# Patient Record
Sex: Female | Born: 1970 | Race: Black or African American | Hispanic: No | Marital: Single | Smoking: Never smoker
Health system: Southern US, Community
[De-identification: ages and names within clinical notes are randomized; demographics above are authoritative.]

## PROBLEM LIST (undated history)

## (undated) DIAGNOSIS — R638 Other symptoms and signs concerning food and fluid intake: Secondary | ICD-10-CM

## (undated) DIAGNOSIS — D219 Benign neoplasm of connective and other soft tissue, unspecified: Secondary | ICD-10-CM

## (undated) DIAGNOSIS — B9689 Other specified bacterial agents as the cause of diseases classified elsewhere: Secondary | ICD-10-CM

## (undated) DIAGNOSIS — A749 Chlamydial infection, unspecified: Secondary | ICD-10-CM

## (undated) DIAGNOSIS — K219 Gastro-esophageal reflux disease without esophagitis: Secondary | ICD-10-CM

## (undated) DIAGNOSIS — T7840XA Allergy, unspecified, initial encounter: Secondary | ICD-10-CM

## (undated) DIAGNOSIS — O209 Hemorrhage in early pregnancy, unspecified: Secondary | ICD-10-CM

## (undated) DIAGNOSIS — N760 Acute vaginitis: Secondary | ICD-10-CM

## (undated) DIAGNOSIS — O2340 Unspecified infection of urinary tract in pregnancy, unspecified trimester: Secondary | ICD-10-CM

## (undated) DIAGNOSIS — Z8619 Personal history of other infectious and parasitic diseases: Secondary | ICD-10-CM

## (undated) DIAGNOSIS — N63 Unspecified lump in unspecified breast: Secondary | ICD-10-CM

## (undated) DIAGNOSIS — Z87440 Personal history of urinary (tract) infections: Secondary | ICD-10-CM

## (undated) DIAGNOSIS — B977 Papillomavirus as the cause of diseases classified elsewhere: Secondary | ICD-10-CM

## (undated) DIAGNOSIS — J45909 Unspecified asthma, uncomplicated: Secondary | ICD-10-CM

## (undated) DIAGNOSIS — O26851 Spotting complicating pregnancy, first trimester: Secondary | ICD-10-CM

## (undated) DIAGNOSIS — IMO0002 Reserved for concepts with insufficient information to code with codable children: Secondary | ICD-10-CM

## (undated) DIAGNOSIS — R896 Abnormal cytological findings in specimens from other organs, systems and tissues: Secondary | ICD-10-CM

## (undated) HISTORY — DX: Unspecified lump in unspecified breast: N63.0

## (undated) HISTORY — DX: Unspecified asthma, uncomplicated: J45.909

## (undated) HISTORY — DX: Hemorrhage in early pregnancy, unspecified: O20.9

## (undated) HISTORY — DX: Papillomavirus as the cause of diseases classified elsewhere: B97.7

## (undated) HISTORY — DX: Other specified bacterial agents as the cause of diseases classified elsewhere: B96.89

## (undated) HISTORY — DX: Personal history of other infectious and parasitic diseases: Z86.19

## (undated) HISTORY — DX: Acute vaginitis: N76.0

## (undated) HISTORY — DX: Abnormal cytological findings in specimens from other organs, systems and tissues: R89.6

## (undated) HISTORY — DX: Other symptoms and signs concerning food and fluid intake: R63.8

## (undated) HISTORY — DX: Personal history of urinary (tract) infections: Z87.440

## (undated) HISTORY — PX: GASTRIC RESTRICTION SURGERY: SHX653

## (undated) HISTORY — PX: FOOT ARTHRODESIS, MODIFIED MCBRIDE: SUR52

## (undated) HISTORY — DX: Reserved for concepts with insufficient information to code with codable children: IMO0002

## (undated) HISTORY — DX: Benign neoplasm of connective and other soft tissue, unspecified: D21.9

## (undated) HISTORY — DX: Unspecified infection of urinary tract in pregnancy, unspecified trimester: O23.40

## (undated) HISTORY — DX: Chlamydial infection, unspecified: A74.9

## (undated) HISTORY — DX: Allergy, unspecified, initial encounter: T78.40XA

## (undated) HISTORY — DX: Spotting complicating pregnancy, first trimester: O26.851

## (undated) HISTORY — DX: Gastro-esophageal reflux disease without esophagitis: K21.9

---

## 1997-04-15 DIAGNOSIS — IMO0002 Reserved for concepts with insufficient information to code with codable children: Secondary | ICD-10-CM

## 1997-04-15 DIAGNOSIS — R87619 Unspecified abnormal cytological findings in specimens from cervix uteri: Secondary | ICD-10-CM

## 1997-04-15 HISTORY — DX: Unspecified abnormal cytological findings in specimens from cervix uteri: R87.619

## 1997-04-15 HISTORY — DX: Reserved for concepts with insufficient information to code with codable children: IMO0002

## 2000-04-15 DIAGNOSIS — B977 Papillomavirus as the cause of diseases classified elsewhere: Secondary | ICD-10-CM

## 2000-04-15 HISTORY — DX: Papillomavirus as the cause of diseases classified elsewhere: B97.7

## 2001-01-16 ENCOUNTER — Other Ambulatory Visit: Admission: RE | Admit: 2001-01-16 | Discharge: 2001-01-16 | Payer: Self-pay | Admitting: Obstetrics and Gynecology

## 2001-07-16 ENCOUNTER — Other Ambulatory Visit: Admission: RE | Admit: 2001-07-16 | Discharge: 2001-07-16 | Payer: Self-pay | Admitting: Obstetrics and Gynecology

## 2001-07-27 ENCOUNTER — Other Ambulatory Visit: Admission: RE | Admit: 2001-07-27 | Discharge: 2001-07-27 | Payer: Self-pay | Admitting: Obstetrics and Gynecology

## 2002-05-28 ENCOUNTER — Other Ambulatory Visit: Admission: RE | Admit: 2002-05-28 | Discharge: 2002-05-28 | Payer: Self-pay | Admitting: Obstetrics and Gynecology

## 2002-08-06 ENCOUNTER — Encounter: Payer: Self-pay | Admitting: Internal Medicine

## 2002-08-06 ENCOUNTER — Encounter: Admission: RE | Admit: 2002-08-06 | Discharge: 2002-08-06 | Payer: Self-pay | Admitting: Internal Medicine

## 2003-09-06 ENCOUNTER — Other Ambulatory Visit: Admission: RE | Admit: 2003-09-06 | Discharge: 2003-09-06 | Payer: Self-pay | Admitting: Obstetrics and Gynecology

## 2004-09-06 ENCOUNTER — Other Ambulatory Visit: Admission: RE | Admit: 2004-09-06 | Discharge: 2004-09-06 | Payer: Self-pay | Admitting: Obstetrics and Gynecology

## 2004-10-13 HISTORY — PX: BUNIONECTOMY: SHX129

## 2005-09-13 ENCOUNTER — Other Ambulatory Visit: Admission: RE | Admit: 2005-09-13 | Discharge: 2005-09-13 | Payer: Self-pay | Admitting: Obstetrics and Gynecology

## 2006-04-15 DIAGNOSIS — N760 Acute vaginitis: Secondary | ICD-10-CM

## 2006-04-15 HISTORY — DX: Acute vaginitis: N76.0

## 2006-06-08 ENCOUNTER — Emergency Department (HOSPITAL_COMMUNITY): Admission: EM | Admit: 2006-06-08 | Discharge: 2006-06-08 | Payer: Self-pay | Admitting: Family Medicine

## 2007-04-16 DIAGNOSIS — B9689 Other specified bacterial agents as the cause of diseases classified elsewhere: Secondary | ICD-10-CM

## 2007-04-16 DIAGNOSIS — N63 Unspecified lump in unspecified breast: Secondary | ICD-10-CM

## 2007-04-16 HISTORY — DX: Other specified bacterial agents as the cause of diseases classified elsewhere: B96.89

## 2007-04-16 HISTORY — DX: Unspecified lump in unspecified breast: N63.0

## 2007-10-28 ENCOUNTER — Ambulatory Visit (HOSPITAL_COMMUNITY): Admission: RE | Admit: 2007-10-28 | Discharge: 2007-10-28 | Payer: Self-pay | Admitting: Surgery

## 2007-10-28 ENCOUNTER — Encounter: Admission: RE | Admit: 2007-10-28 | Discharge: 2007-10-28 | Payer: Self-pay | Admitting: Surgery

## 2007-10-30 ENCOUNTER — Ambulatory Visit (HOSPITAL_COMMUNITY): Admission: RE | Admit: 2007-10-30 | Discharge: 2007-10-30 | Payer: Self-pay | Admitting: Surgery

## 2007-10-30 IMAGING — US US ABDOMEN COMPLETE
1 series · 13 of 25 positions shown · non-contrast
Comparison: None

CLINICAL DATA: Morbid obesity

ABDOMEN ULTRASOUND
TECHNIQUE: Complete abdominal ultrasound examination was performed
including evaluation of the liver, gallbladder, bile ducts,
pancreas, kidneys, spleen, IVC, and abdominal aorta.

[Series 1: us abdomen complete · 0.28mm/px · 13 of 61 slices shown]
[im 1/61]
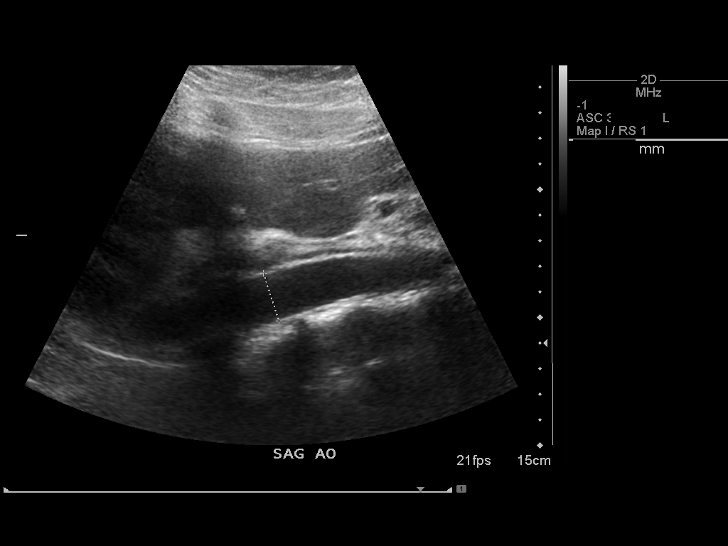
[im 6/61]
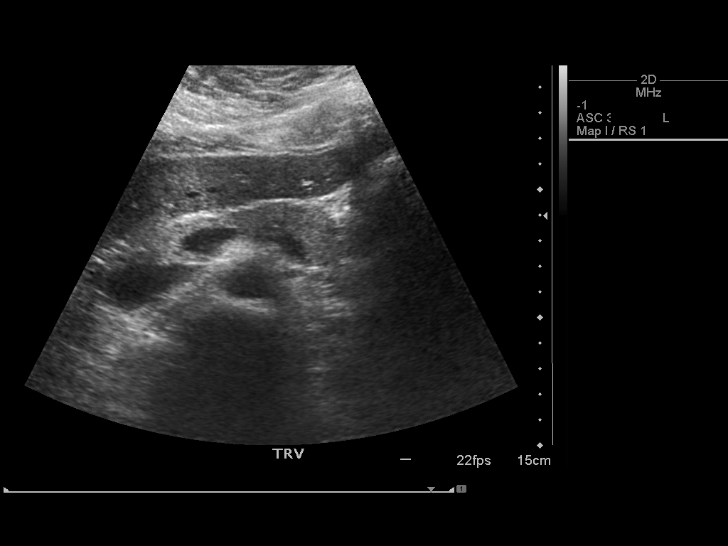
[im 11/61]
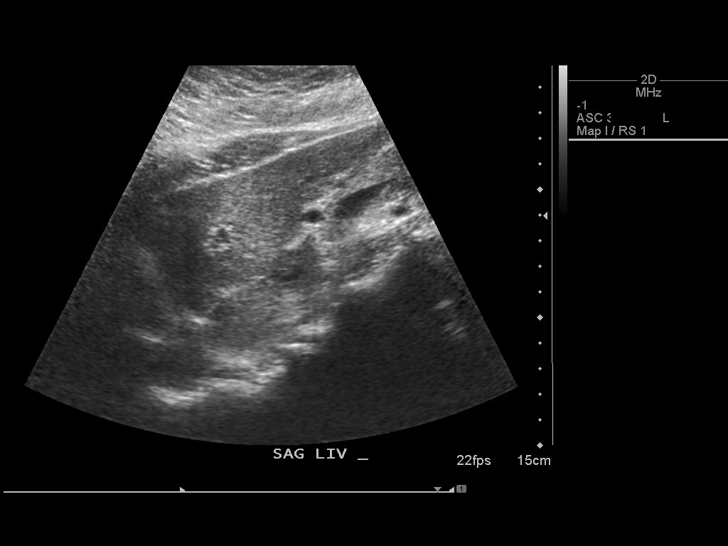
[im 16/61]
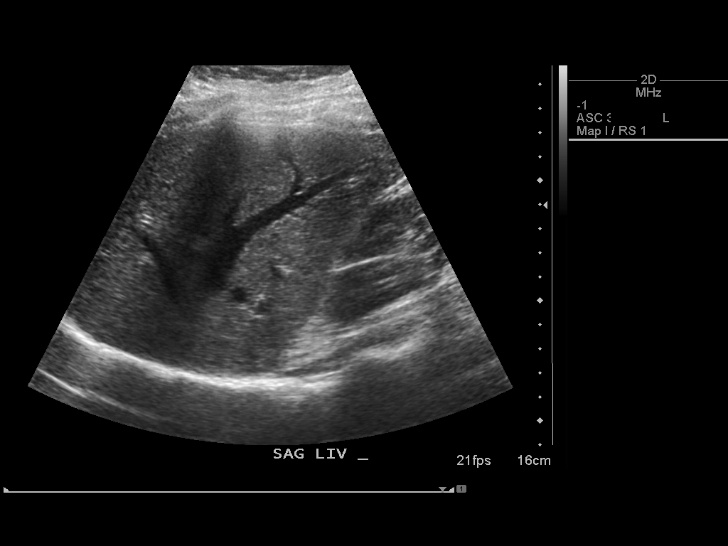
[im 21/61]
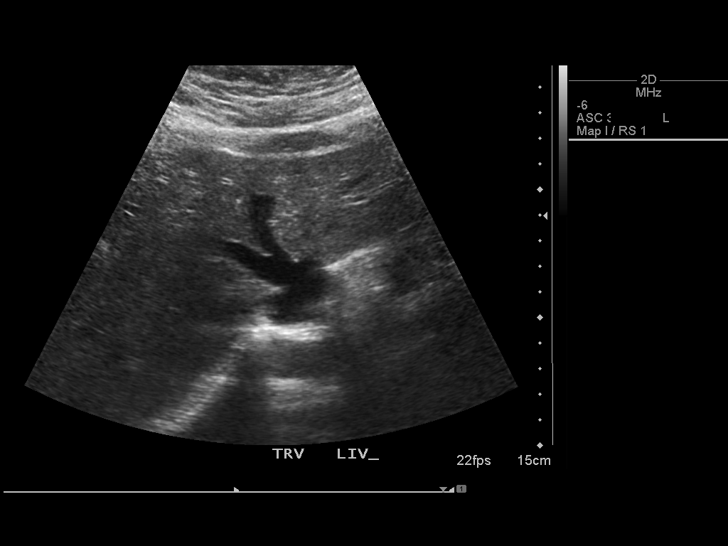
[im 26/61]
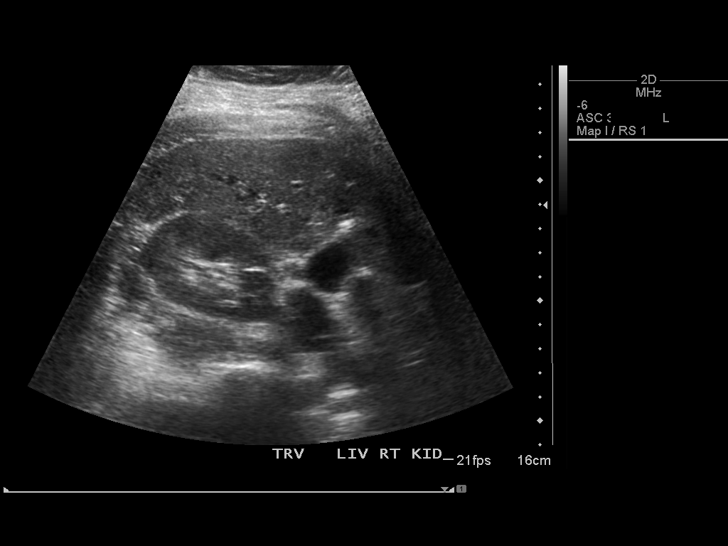
[im 31/61]
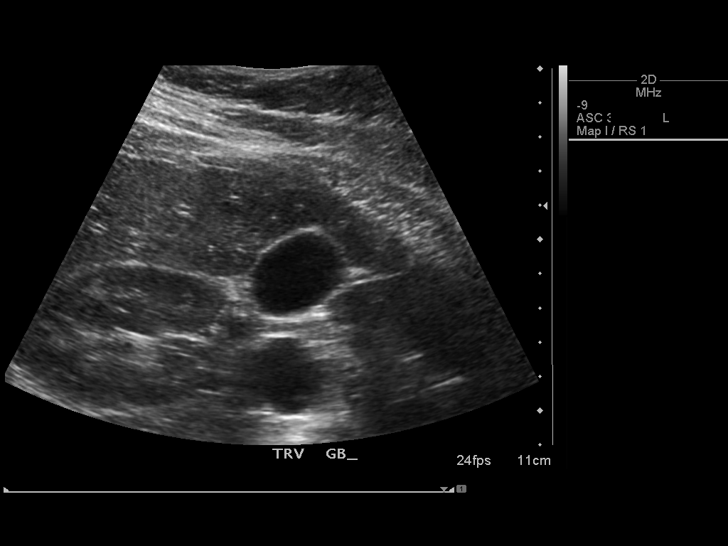
[im 36/61]
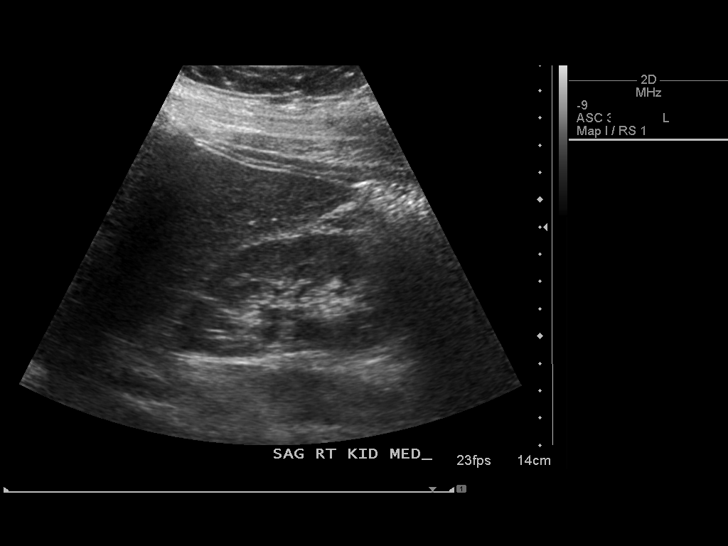
[im 41/61]
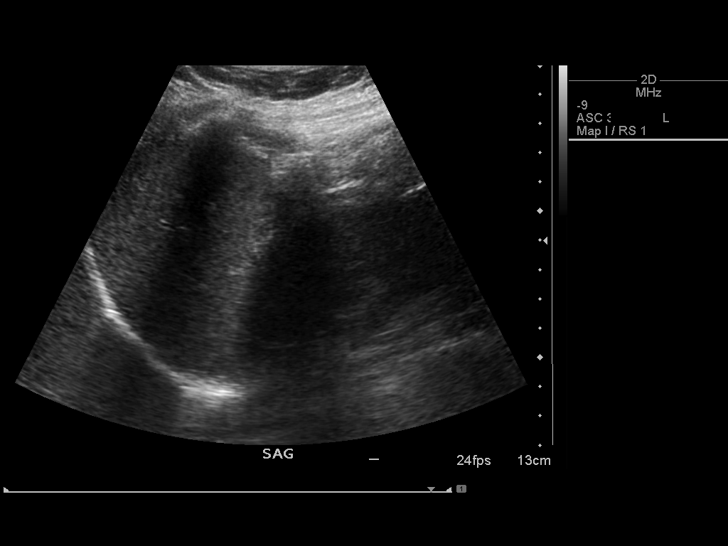
[im 46/61]
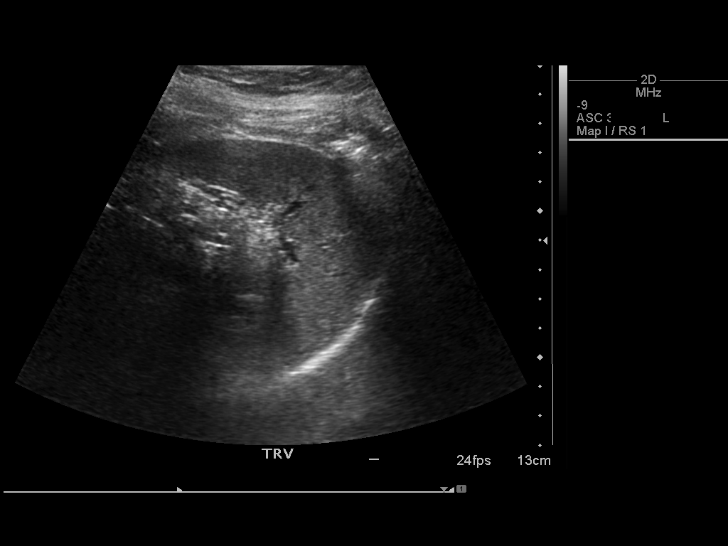
[im 51/61]
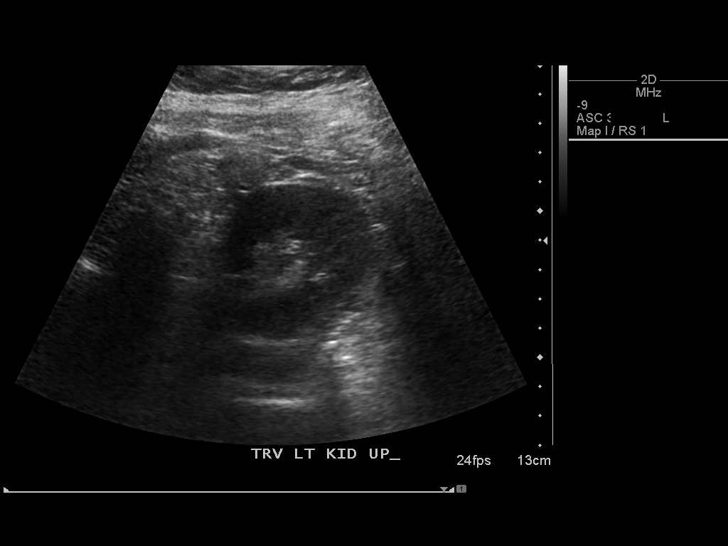
[im 56/61]
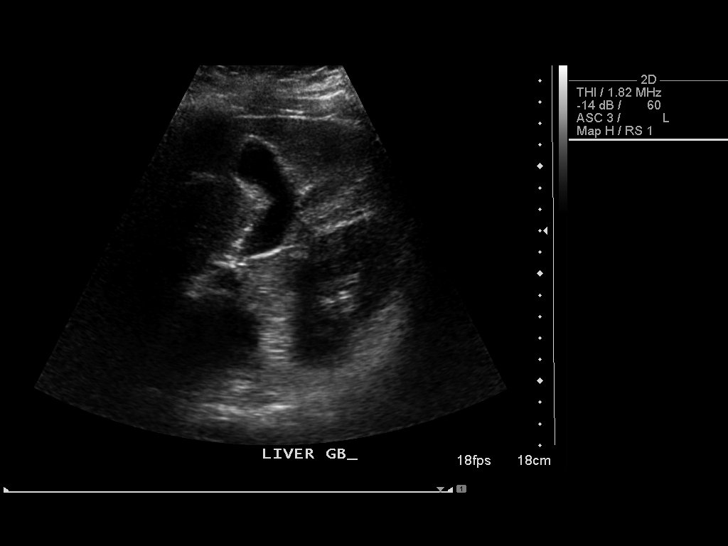
[im 61/61]
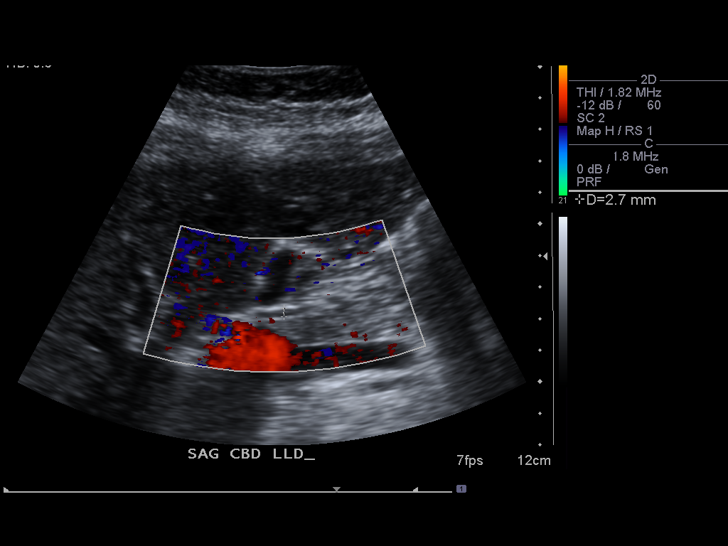

[13 of 25 positions shown; findings below may reference images not displayed]

FINDINGS: The liver parenchyma appears homogeneous in echotexture
with no evidence for focal parenchymal abnormality or intrahepatic
ductal dilatation.  The gallbladder is well distended and shows no
evidence for intraluminal stones or sludge.  No pericholecystic
fluid or gallbladder wall thickening is seen.  The common bile duct
measures 2.7 mm in AP width and this is within normal limits.

The pancreas is seen in its entirety and is normal in size and
echotexture.  Both kidneys have a normal appearance with the right
kidney having a sagittal length of 11.2 cm and the left kidney
having a sagittal length of 10.2 cm.  No focal parenchymal
abnormalities or signs of hydronephrosis are noted.  The spleen is
normal in appearance.

The abdominal aorta has a maximal caliber of 2.0 cm.  The proximal
portion of the inferior vena cava is unremarkable.  No intra-
abdominal fluid is seen.
IMPRESSION: Normal abdominal ultrasound

## 2008-02-14 DIAGNOSIS — O209 Hemorrhage in early pregnancy, unspecified: Secondary | ICD-10-CM

## 2008-02-14 HISTORY — DX: Hemorrhage in early pregnancy, unspecified: O20.9

## 2008-07-12 ENCOUNTER — Inpatient Hospital Stay (HOSPITAL_COMMUNITY): Admission: AD | Admit: 2008-07-12 | Discharge: 2008-07-12 | Payer: Self-pay | Admitting: Obstetrics and Gynecology

## 2008-07-20 ENCOUNTER — Ambulatory Visit (HOSPITAL_COMMUNITY): Admission: RE | Admit: 2008-07-20 | Discharge: 2008-07-20 | Payer: Self-pay | Admitting: Obstetrics and Gynecology

## 2008-08-03 ENCOUNTER — Ambulatory Visit (HOSPITAL_COMMUNITY): Admission: RE | Admit: 2008-08-03 | Discharge: 2008-08-03 | Payer: Self-pay | Admitting: Obstetrics and Gynecology

## 2008-08-05 ENCOUNTER — Inpatient Hospital Stay (HOSPITAL_COMMUNITY): Admission: AD | Admit: 2008-08-05 | Discharge: 2008-08-08 | Payer: Self-pay | Admitting: Obstetrics and Gynecology

## 2008-08-17 ENCOUNTER — Ambulatory Visit (HOSPITAL_COMMUNITY): Admission: RE | Admit: 2008-08-17 | Discharge: 2008-08-17 | Payer: Self-pay | Admitting: Obstetrics and Gynecology

## 2008-08-22 ENCOUNTER — Inpatient Hospital Stay (HOSPITAL_COMMUNITY): Admission: AD | Admit: 2008-08-22 | Discharge: 2008-08-27 | Payer: Self-pay | Admitting: Obstetrics and Gynecology

## 2008-09-13 DIAGNOSIS — O2340 Unspecified infection of urinary tract in pregnancy, unspecified trimester: Secondary | ICD-10-CM

## 2008-09-13 DIAGNOSIS — O26851 Spotting complicating pregnancy, first trimester: Secondary | ICD-10-CM

## 2008-09-13 HISTORY — DX: Unspecified infection of urinary tract in pregnancy, unspecified trimester: O23.40

## 2008-09-13 HISTORY — DX: Spotting complicating pregnancy, first trimester: O26.851

## 2008-09-30 ENCOUNTER — Inpatient Hospital Stay (HOSPITAL_COMMUNITY): Admission: RE | Admit: 2008-09-30 | Discharge: 2008-10-03 | Payer: Self-pay | Admitting: Obstetrics and Gynecology

## 2008-09-30 ENCOUNTER — Encounter (INDEPENDENT_AMBULATORY_CARE_PROVIDER_SITE_OTHER): Payer: Self-pay | Admitting: Obstetrics and Gynecology

## 2008-09-30 DIAGNOSIS — D219 Benign neoplasm of connective and other soft tissue, unspecified: Secondary | ICD-10-CM

## 2008-09-30 HISTORY — DX: Benign neoplasm of connective and other soft tissue, unspecified: D21.9

## 2009-10-10 ENCOUNTER — Ambulatory Visit (HOSPITAL_COMMUNITY): Admission: RE | Admit: 2009-10-10 | Discharge: 2009-10-10 | Payer: Self-pay | Admitting: Obstetrics and Gynecology

## 2009-10-10 HISTORY — PX: OTHER SURGICAL HISTORY: SHX169

## 2009-10-10 HISTORY — PX: MYOMECTOMY: SHX85

## 2009-11-22 ENCOUNTER — Encounter: Admission: RE | Admit: 2009-11-22 | Discharge: 2010-01-11 | Payer: Self-pay | Admitting: Surgery

## 2010-01-30 ENCOUNTER — Encounter
Admission: RE | Admit: 2010-01-30 | Discharge: 2010-04-18 | Payer: Self-pay | Source: Home / Self Care | Attending: Surgery | Admitting: Surgery

## 2010-02-13 ENCOUNTER — Ambulatory Visit (HOSPITAL_COMMUNITY): Admission: AD | Admit: 2010-02-13 | Discharge: 2010-02-14 | Payer: Self-pay | Admitting: Surgery

## 2010-02-13 HISTORY — PX: LAPAROSCOPIC GASTRIC BANDING: SHX1100

## 2010-02-14 ENCOUNTER — Ambulatory Visit: Payer: Self-pay | Admitting: Vascular Surgery

## 2010-02-14 ENCOUNTER — Encounter (INDEPENDENT_AMBULATORY_CARE_PROVIDER_SITE_OTHER): Payer: Self-pay | Admitting: Surgery

## 2010-02-27 ENCOUNTER — Encounter
Admission: RE | Admit: 2010-02-27 | Discharge: 2010-02-27 | Payer: Self-pay | Source: Home / Self Care | Attending: Surgery | Admitting: Surgery

## 2010-05-07 ENCOUNTER — Encounter: Payer: Self-pay | Admitting: Obstetrics and Gynecology

## 2010-05-30 ENCOUNTER — Ambulatory Visit: Payer: Self-pay | Admitting: *Deleted

## 2010-05-30 ENCOUNTER — Encounter: Admit: 2010-05-30 | Payer: Self-pay | Admitting: Surgery

## 2010-06-26 LAB — CBC
HCT: 35.5 % — ABNORMAL LOW (ref 36.0–46.0)
MCH: 30.5 pg (ref 26.0–34.0)
MCHC: 33.8 g/dL (ref 30.0–36.0)
MCV: 90.3 fL (ref 78.0–100.0)
Platelets: 221 10*3/uL (ref 150–400)
RDW: 13.6 % (ref 11.5–15.5)
WBC: 7.2 10*3/uL (ref 4.0–10.5)

## 2010-06-26 LAB — DIFFERENTIAL
Basophils Absolute: 0 10*3/uL (ref 0.0–0.1)
Eosinophils Absolute: 0 10*3/uL (ref 0.0–0.7)
Eosinophils Relative: 0 % (ref 0–5)
Monocytes Absolute: 0.4 10*3/uL (ref 0.1–1.0)

## 2010-06-27 LAB — COMPREHENSIVE METABOLIC PANEL
ALT: 14 U/L (ref 0–35)
Albumin: 3.4 g/dL — ABNORMAL LOW (ref 3.5–5.2)
Alkaline Phosphatase: 78 U/L (ref 39–117)
BUN: 10 mg/dL (ref 6–23)
Calcium: 9 mg/dL (ref 8.4–10.5)
Glucose, Bld: 84 mg/dL (ref 70–99)
Potassium: 3.9 mEq/L (ref 3.5–5.1)
Sodium: 142 mEq/L (ref 135–145)
Total Protein: 6.9 g/dL (ref 6.0–8.3)

## 2010-06-27 LAB — DIFFERENTIAL
Basophils Relative: 1 % (ref 0–1)
Lymphs Abs: 1.8 10*3/uL (ref 0.7–4.0)
Monocytes Absolute: 0.4 10*3/uL (ref 0.1–1.0)
Monocytes Relative: 7 % (ref 3–12)
Neutro Abs: 2.8 10*3/uL (ref 1.7–7.7)
Neutrophils Relative %: 56 % (ref 43–77)

## 2010-06-27 LAB — CBC
MCHC: 34 g/dL (ref 30.0–36.0)
Platelets: 240 10*3/uL (ref 150–400)
RDW: 13.7 % (ref 11.5–15.5)
WBC: 5.1 10*3/uL (ref 4.0–10.5)

## 2010-06-27 LAB — PREGNANCY, URINE: Preg Test, Ur: NEGATIVE

## 2010-06-27 LAB — SURGICAL PCR SCREEN: MRSA, PCR: NEGATIVE

## 2010-07-01 LAB — CBC
HCT: 35.4 % — ABNORMAL LOW (ref 36.0–46.0)
Hemoglobin: 12.1 g/dL (ref 12.0–15.0)
MCHC: 34.3 g/dL (ref 30.0–36.0)
RBC: 3.98 MIL/uL (ref 3.87–5.11)

## 2010-07-01 LAB — HCG, SERUM, QUALITATIVE: Preg, Serum: NEGATIVE

## 2010-07-23 LAB — TYPE AND SCREEN
ABO/RH(D): O POS
Antibody Screen: NEGATIVE

## 2010-07-23 LAB — CBC
HCT: 37.7 % (ref 36.0–46.0)
Hemoglobin: 12.9 g/dL (ref 12.0–15.0)
MCHC: 34.8 g/dL (ref 30.0–36.0)
MCV: 95.4 fL (ref 78.0–100.0)
MCV: 95.7 fL (ref 78.0–100.0)
RBC: 3.32 MIL/uL — ABNORMAL LOW (ref 3.87–5.11)
RBC: 3.95 MIL/uL (ref 3.87–5.11)
RDW: 13.4 % (ref 11.5–15.5)
WBC: 7.3 10*3/uL (ref 4.0–10.5)

## 2010-07-23 LAB — URINE CULTURE
Colony Count: NO GROWTH
Culture: NO GROWTH
Special Requests: NEGATIVE

## 2010-07-24 LAB — CBC
MCHC: 35.4 g/dL (ref 30.0–36.0)
MCV: 95.7 fL (ref 78.0–100.0)
Platelets: 183 10*3/uL (ref 150–400)
WBC: 7.2 10*3/uL (ref 4.0–10.5)

## 2010-07-25 LAB — CBC
HCT: 32.7 % — ABNORMAL LOW (ref 36.0–46.0)
Hemoglobin: 11.4 g/dL — ABNORMAL LOW (ref 12.0–15.0)
MCHC: 34.3 g/dL (ref 30.0–36.0)
MCHC: 35 g/dL (ref 30.0–36.0)
MCHC: 35 g/dL (ref 30.0–36.0)
MCV: 95.1 fL (ref 78.0–100.0)
Platelets: 156 10*3/uL (ref 150–400)
RBC: 3.35 MIL/uL — ABNORMAL LOW (ref 3.87–5.11)
RDW: 12.9 % (ref 11.5–15.5)
RDW: 13.2 % (ref 11.5–15.5)
WBC: 7.9 10*3/uL (ref 4.0–10.5)

## 2010-07-25 LAB — DIFFERENTIAL
Basophils Relative: 0 % (ref 0–1)
Lymphocytes Relative: 9 % — ABNORMAL LOW (ref 12–46)
Monocytes Relative: 6 % (ref 3–12)
Neutro Abs: 6.8 10*3/uL (ref 1.7–7.7)
Neutrophils Relative %: 85 % — ABNORMAL HIGH (ref 43–77)

## 2010-07-26 LAB — URINE MICROSCOPIC-ADD ON

## 2010-07-26 LAB — URINALYSIS, ROUTINE W REFLEX MICROSCOPIC
Nitrite: NEGATIVE
Specific Gravity, Urine: 1.025 (ref 1.005–1.030)
Urobilinogen, UA: 0.2 mg/dL (ref 0.0–1.0)

## 2010-07-26 LAB — STREP B DNA PROBE: Strep Group B Ag: NEGATIVE

## 2010-07-26 LAB — GC/CHLAMYDIA PROBE AMP, GENITAL: Chlamydia, DNA Probe: NEGATIVE

## 2010-07-26 LAB — WET PREP, GENITAL: Clue Cells Wet Prep HPF POC: NONE SEEN

## 2010-08-28 NOTE — Discharge Summary (Signed)
NAMESONDA, COPPENS              ACCOUNT NO.:  [id number]   MEDICAL RECORD NO.:  [id number]          PATIENT TYPE:  INP   LOCATION:  9158                          [facility]ILITY:  [facility]   PHYSICIAN:  Hal Morales, M.D.DATE OF BIRTH:  04/21/1970   DATE OF ADMISSION:  08/22/2008  DATE OF DISCHARGE:  08/27/2008                               DISCHARGE SUMMARY   Ms. Rudge is a 40 year old gravida 1, who presented at 31 weeks and 3  days with vaginal bleeding and marginal placenta previa, previously  diagnosed.   ADMISSION DIAGNOSES:  1. Intrauterine pregnancy at 31 weeks 3 days.  2. Partial placenta previa with vaginal bleeding.  3. Pedunculated cervical fibroid.  4. Urinary tract infection.  5. AMA.  6. Increased BMI.  7. Asthma.  8. Gastroesophageal reflux disease.  9. Pityriasis rosea.   DISCHARGE DIAGNOSES:  1. Intrauterine pregnancy at 32 weeks 1 day.  2. Partial placenta previa, no bleeding greater than 24 hours.  3. Cervical fibroid.  4. Urinary tract infection, treated.  5. AMA.  6. Increased BMI.  7. Asthma.  8. Gastroesophageal reflux disease.  9. Pityriasis rosea, controlled.   PERTINENT LABS:  On May 10, she had a white blood cell count of 7.2.  Her hemoglobin was 12.0, hematocrit was 33.9, platelet count was  183,000.  Her ultrasound on May 11 showed an AFI of 12.46, estimated  fetal weight 3 pounds 14 ounces which is in the 69th percentile,  posterior marginal previa.  The fibroid was noted to be 7.81 x 7.79 x  7.11 pedunculated and in the posterior cervix.  Fetal size was  concordant with weight.  The patient had betamethasone x2 on her  previous admission a month ago.   PROCEDURES:  The ultrasound and IV Ancef for her UTI.   The patient continued to have light bleeding for several days and then  eventually tapered down to nothing for more than 24 hours meeting  criteria for discharge today.   PHYSICAL EXAMINATION:  Today was within normal limits.  Lungs  clear to  auscultation bilaterally.  No extremity edema.  Normal DTRs.  Fetal  heart rate was normal.  Abdomen was soft.  No contractions.  Her TOCO  monitoring as I mentioned before, no bleeding for greater than 24 hours.   The patient was discharged home on bed rest with bleeding precautions.   DISCHARGE MEDICATIONS:  Prenatal vitamin and her albuterol inhaler.   She has an appointment in the beginning of next week either Monday or  Tuesday, which is 2 or 3 days away probably.      Eulogio Bear, CNM      Hal Morales, M.D.  Electronically Signed    JM/MEDQ  D:  08/27/2008  T:  08/28/2008  Job:  [id number]

## 2010-08-28 NOTE — H&P (Signed)
NAMEMARDEL, GRUDZIEN              ACCOUNT NO.:  [id number]   MEDICAL RECORD NO.:  [id number]          PATIENT TYPE:  INP   LOCATION:  9120                          [facility]ILITY:  [facility]   PHYSICIAN:  Osborn Coho, M.D.   DATE OF BIRTH:  1970-10-27   DATE OF ADMISSION:  09/30/2008  DATE OF DISCHARGE:                              HISTORY & PHYSICAL   Karen Brock is a 40 year old single African American female, primigravida  at [redacted] weeks gestation per an [facility] of October 21, 2008, who presents on the  date of admission for a scheduled primary low transverse cesarean  section secondary to persisting marginal previa and posterior cervical  fibroid.  The patient has been followed primarily by Dr. Osborn Coho  at [facility].  Her history has been remarkable for;  1. Advanced maternal age.  2. Increased BMI.  3. UTI first and third trimester.  4. Vaginal bleeding throughout her pregnancy intermittently  5. Acid reflux intermittently.  6. Seasonal asthma, well controlled with albuterol inhaler.  7. History of abnormal Pap in 1999, with normal colposcopy.  8. History of treatment for chlamydia, as well as condylomata.  9. Persisting marginal previa in pregnancy.  10.Diagnosed cervical fibroid in pregnancy.   OBSTETRICAL HISTORY:  She is a primigravida.   MENSTRUAL HISTORY:  Menarche at age 70, monthly cycles, no abnormalities.  Reported an LMP of January 15, 2008.  She had ultrasound on February 28, 2006, confirming an [facility] of October 21, 2008.   ALLERGIES:  SHE REPORTS SHELLFISH CAUSES THROAT TO ITCH AND AS WELL LIP  SWELLING, BUT REPORTED BETADINE IS OKAY.   PAST MEDICAL HISTORY:  For contraception, she has used NuvaRing, Ortho  Tri-Cyclen and Levora.  Abnormal Pap in 1999.  Colpo was done and follow-  ups have been normal.  She reports having uterus tilted to her right.  Treated for chlamydia, as well as condylomata in the past.  Yeast  infections frequently with antibiotics.   Reports varicella as a child.  Hepatitis B vaccines.  Normal childhood illnesses.  Seasonal induced  asthma and reports it was worse as a child.  She uses p.r.n. albuterol  inhaler.  Acid reflux at times.  Prior to pregnancy, she was not on  regular medication.  She had, had some problems with constipation during  the pregnancy.   SURGICAL HISTORY:  Remarkable for right foot bunion surgery in 2004.   GENETIC HISTORY:  Remarkable for patient of advanced maternal age.   FAMILY HISTORY:  Mom and maternal grandmother with cardiovascular  disease.  Mom and dad hypertension.  Maternal aunt varicosities.  Paternal aunt and maternal first cousin with epilepsy.  Paternal first  cousin with HIV.  Maternal grandfather with pancreatic cancer.  Maternal  first cousin with prostate cancer.  She does have a maternal aunt who  has a history of abuse and maternal grandfather alcoholism.   SOCIAL HISTORY:  She is a single [place] American female.  The father of  the baby's name is Cherre Huger.  The patient has a master's degree.  She is a full-time Facilities manager  pathologist.  The father of the baby is a  Surveyor, minerals.  He was unemployed at the time of her new OB, but  does have a bachelor's degree.  She reported ingestion of wine before  knowing she was pregnant, but otherwise denies tobacco or illicit drug  use.   PRENATAL LABS:  Her blood type is O+, Rh antibody screen was negative.  Sickle cell trait negative, RPR nonreactive, rubella titer immune.  Hepatitis surface antigen negative, HIV nonreactive.  First trimester  screen within normal limits.  Pap in July 2009, was within normal  limits.  Hemoglobin 12.9 on April 04, 2008.  Hematocrit 39.5,  platelets 235.  She did have a UTI diagnosed on April 04, 2008, and  was treated with Septra DS.   HISTORY OF PRESENT PREGNANCY:  She entered care for new OB interview at  [facility] on March 07, 2009.  She was approximately 7 weeks   and 3 days.  She reported a pregravid weight of 240.  Her weight at that  time was 241.  She is 5 feet 3 inches.  She declined amniocentesis  during pregnancy, but did have first trimester screen done on April 13, 2008, and it was within normal limits.  She did have some ringworm  on her back and was treated with Mycolog cream at that time.  As I  mentioned, she did have a positive UTI and was treated in early January.  She did start complaining of reflux early second trimester and comfort  measures were discussed.  She had early Glucola at 18-3/7 weeks and it  was within normal limits equal to 95.  Urine test was sent at 18-3/7  weeks and it was negative.  She still had a 9 cm fungal patch right  lower back at that time and was referred to dermatology, but was having  some relief by nystatin cream.  She had an anatomy ultrasound that day,  single intrauterine pregnancy, size consistent with previous dating,  cervix 4.07 cm, normal fluid, posterior placenta, suggestive female.  All  anatomy was seen and no anomalies were observed.  Her primary risk was  1:4001, adjusted risk 1:8002.  She continued having some problems with  the ringworm around 20 weeks and was unable to quickly get in to see  dermatologist.  She was prescribed some Atarax at that time to help with  itching.  Another urine culture was sent at 20 weeks and it was again  negative.  After going to dermatologist, she was diagnosed with  __________rosacea.  She had some vaginal bleeding around 25 weeks and  was sent to [facility] for evaluation and was  diagnosed with a large cervical fibroid which did obscure view with  speculum on exams.  She had an ultrasound on Sep 11, 2008, as it showed  estimated fetal weight in the 66th percentile, normal fluid and the  positive cervical fibroid.  She had a repeat Glucola at 26-3/7 weeks.  When she was seen on July 13, 2008 in [facility] they did also diagnosed her  with  marginal previa.  Her Glucola was again within normal limits equal  to 97.  RPR was nonreactive.  On June 15, 2008, she also had GBS and GC  chlamydia cultures which were both negative.  She was referred during  the pregnancy per MFM consult regarding persisting marginal previa, as  well as a cervical fibroid in lieu of ability for vaginal versus  cesarean  delivery.  Via ultrasound fetal growth remained within normal  limits and normal fluid.  The patient was admitted several occasions in  third trimester for persisting vaginal bleeding exacerbated by the  previa.  She was put on modified bed rest.  She was seen by MFM.  On Aug 17, 2008, estimated fetal weight was in the 69th percentile, persisting  posterior marginal previa was seen.  Her posterior cervical fibroid was  measuring 7.81 x 7.79 x 7.11 and was pedunculated.  Given these  findings, Dr. Rica Koyanagi maternal fetal medicine did recommend  cesarean delivery secondary to the fibroid, as well as persisting  previa.  Subsequently, the patient was admitted on September 30, 2008, at [redacted]  weeks gestation for her primary low transverse cesarean section.   OBJECTIVE:  VITAL SIGNS:  She was afebrile and vital signs were stable  on admission.  Positive fetal heart tones.  She was not complaining of  any regular contractions.  GENERAL:  No acute distress, alert and oriented x3 and pleasant.  HEENT:  The patient does wear glasses, but grossly intact.  CARDIOVASCULAR:  Regular rate and rhythm without murmur.  LUNGS:  Clear to auscultation bilaterally.  ABDOMEN:  Soft, nontender and gravid.  PELVIC:  Deferred.  EXTREMITIES:  Within normal limits.  No edema.  DTRs were 1+.   IMPRESSION:  1. Intrauterine pregnancy at [redacted] weeks gestation.  2. Persisting marginal previa.  3. Posterior pedunculated large cervical fibroid.   PLAN:  1. The patient admitted to [facility] [place] with routine      preoperative orders.  2. The risks,  benefits and alternatives were discussed with the      patient regarding cesarean delivery with indications of persisting      marginal previa, as well as cervical fibroid.  A C-section was      recommended both by Dr. Osborn Coho and maternal fetal medicine.      The patient was agreeable with plan of care.  All questions were      answered.  The patient was prepped for OR and Dr. Osborn Coho is      to follow.      Candice [place], [place]      Osborn Coho, M.D.  Electronically Signed    CHS/MEDQ  D:  10/03/2008  T:  10/03/2008  Job:  [id number]

## 2010-08-28 NOTE — Discharge Summary (Signed)
NAME:  Karen Brock, Karen Brock              ACCOUNT NO.:  [id number]   MEDICAL RECORD NO.:  [id number]          PATIENT TYPE:  INP   LOCATION:  9120                          [facility]   PHYSICIAN:  Crist Fat. Rivard, M.D. DATE OF BIRTH:  09-Jun-1970   DATE OF ADMISSION:  09/30/2008  DATE OF DISCHARGE:  10/03/2008                               DISCHARGE SUMMARY   ADMITTING DIAGNOSES:  1. Intrauterine pregnancy at 37 weeks.  2. Persistent marginal previa.  3. Cervical fibroid.   DISCHARGE DIAGNOSES:  1. Intrauterine pregnancy at 37 weeks.  2. Persistent marginal previa.  3. Cervical fibroid.   PROCEDURE:  Primary low transverse cesarean section.   HOSPITAL COURSE:  Karen Brock is a 40 year old, gravida 1, para 0, at 37  weeks, who presented on the day of admission for primary low transverse  cesarean section secondary to a previa.  The patient's pregnancy had  been remarkable for:  1. Advanced sternal age.  2. UTI at first trimester and third trimester.  3. First trimester spotting with previa diagnosed.  4. The patient also has a large cervical fibroid.  5. Elevated BMI.  6. Seasonal asthma.  7. Negative group B strep.   The patient was taken to the operating room where a primary low  transverse cesarean section was performed by Dr. Su Hilt under epidural  anesthesia.  Findings were a viable female by the name of Karen Brock, Apgars  were 8 and 9, weight was 6 pounds 15 ounces.  Infant was taken to the  full-term nursery.  Mother was taken to recovery in good condition.  On  postop day 1, the patient was doing well.  She was up ad lib.  She was  working on breast-feeding.  She was having some nausea but had no  vomiting.  Her pain was well controlled on p.o. pain meds.  Her physical  exam was within normal limits.  Her incision was clean, dry, and intact.  Her hemoglobin was 11.1 down from 12.9, hematocrit was 31.8, white blood  cell count of 7.8, and platelet count was 129, it had been  138 prior to  delivery.  She was placed on heparin for VTE prophylaxis.  Epidural  catheter was removed without difficulty.  Rest of the patient's hospital  stay was uncomplicated.  Her incision remained clean, dry, and intact.  She was planning on using Micronor.  By postop day 3, she was again  still doing well.  Her incision was clean, dry, and intact.  Fundus was  firm.  Extremities were within normal limits with a negative Homans.  An  urine culture had been ordered; however, this was not accomplished prior  to removing the Foley.  The patient was having no dysuria and no  postpartum depression symptoms.  She was deemed to receive full benefit  of her hospital stay and was discharged home.  Discharge instructions  per [facility] handout.   DISCHARGE MEDICATIONS:  1. Motrin 600 mg p.o. q.6 h. p.r.n. pain.  2. Percocet 5/325 one to two p.o. q.3-4 h. p.r.n. pain.  3. Micronor 1 p.o.  daily.  4. The patient will continue her previous Keflex to complete the full      course.   Discharge followup will occur in 6 weeks at [facility].      Karen Brock, C.N.M.      Crist Fat Rivard, M.D.  Electronically Signed    VLL/MEDQ  D:  10/03/2008  T:  10/03/2008  Job:  [id number]

## 2010-08-28 NOTE — H&P (Signed)
NAME:  Karen Brock, Karen Brock              ACCOUNT NO.:  [id number]   MEDICAL RECORD NO.:  [id number]          PATIENT TYPE:  INP   LOCATION:  9159                          FACILITY:  [facility]   PHYSICIAN:  Hal Morales, M.D.DATE OF BIRTH:  08-05-1970   DATE OF ADMISSION:  08/05/2008  DATE OF DISCHARGE:                              HISTORY & PHYSICAL   The patient is a 40 year old gravida 1, para 0 at [redacted] weeks gestation per  an [facility] of October 21, 2008 who presents with chief complaint of vaginal  bleeding which has been bright red since 5 o'clock this afternoon.  She  denies any pain, abdominal pain, specifically no cramping, no abdominal  tightening.  No GI or respiratory complaints.  No UTI or PIH signs or  symptoms.  She has had a bowel movement today, reports good fetal  movement.  She reports that at 5:00 p.m. when she went to the bathroom  she had bright red vaginal bleeding subsequent to she put a pad on and  it did saturate through that pad.  After putting a second pad on, it  just had a light amount of vaginal bleeding approximately 50% with  superficial saturation.  She reports her last episode of spotting was  March 30.  Prenatal vitamins is her only medication.  No recent  antibiotic.  She does have some seasonal allergies.  Claritin and  inhaler used p.r.n.   The patient has been followed it looks like by both nurse midwife and MD  service at [facility].  Her history has been remarkable for  1. Advanced maternal age.  2. First trimester UTI.  3. First trimester spotting as well as second trimester spotting and      now as well as third trimester spotting.  4. Increased BMI.  5. Seasonal asthma.   OBSTETRICAL HISTORY:  She is a primigravida.   ALLERGIES AND MEDICATIONS:  Denies medication or latex allergy does have  sensitivities to shellfish which causes throat itching, lip swelling but  Betadine  is okay.   MENSTRUAL HISTORY:  Menarche age 79.  Looks like monthly cycles with no  abnormalities.  Had reported an LMP of January 15, 2008.  Her best EDC is  by an ultrasound February 29, 2008 which made her [facility] October 21, 2008.   PAST MEDICAL HISTORY:  Her blood type is O+.  Contraception in the past:  She has used NuvaRing, Ortho Tri-Cyclen and Levora.  She has had an  abnormal Pap smear 1999, colposcopy was done.  Her last Pap smear was in  July 2009 and was normal and was in our office with  Henreitta Leber PA.  She does report a 10 feet uterus to her right.  Treated for chlamydia  which she reports years ago as well as condyloma years ago.  Infrequent yeast infection with antibiotics.  She reports varicella as a  child.  She has also had hepatitis B series vaccines.  She does have  allergy induced asthma along with seasons and uses albuterol inhaler  p.r.n.  She reports that it was much worse as  a child.  She does have  acid reflux that time.  She is not on any medication.  She has had some  issues with constipation during the pregnancy.  Infrequent UTIs.   She has had a bunion on her right foot which she has had surgery on in  2004.  That is her only surgical history.   GENETIC HISTORY:  Unremarkable.   FAMILY HISTORY:  Mom and maternal grandmother heart disease.  Mom and  dad both chronic hypertensive.  Maternal aunt varicosities.  Maternal  aunt and a maternal first cousin epilepsy.  Paternal first cousin HIV.  Maternal grandfather pancreatic cancer.  Maternal first cousin prostate  cancer.  Maternal grandfather alcoholism.   SOCIAL HISTORY:  She is a single [place] American female.  The father of  baby's name is Pharmacologist.  The patient has her master's degree  works full-time as Doctor, general practice.  The father of baby has his  bachelor's and is a Surveyor, minerals, currently unemployed.  Her  primary care physician is Dr. Selena Batten with [facility].  She reports wine  used before pregnancy but denies tobacco or illicit drug use.  She  reported pregravid  weight of  240.  She is 5 feet 3 inches.  Weight at  her new OB was 241.  Prenatal labs which were done April 04, 2008:  Hemoglobin was 12.9, hematocrit 39.5, platelets 235.  O+, Rh antibody  screen negative.  Sickle cell screen negative, RPR negative, rubella  titer immune.  Hepatitis surface antigen negative, HIV negative.  First  trimester screen was within normal limits.  Gonorrhea and chlamydia  cultures in February 24, 2008 were negative.  She did have a positive  UTI in her first trimester.  Her RPR was nonreactive in her third  trimester and 1-hour Glucola was equal to 97.   HISTORY OF PRESENT PREGNANCY:  The patient entered care November 23 for  new OB interview, prenatal vitamins.  She was around 7 weeks and 3 days.  She returned for new OB workup on December 21 and she was around 11-12  weeks.  Weight at that time was 241.  She was normotensive and she  declined amniocentesis but did desire first trimester screen.  She did  report ringworm on her back and a fungal area growing and was prescribed  Mycolog cream.  Had normal first trimester screen.  As I  mentioned had  a positive UTI culture which was treated with Septra DS.  Was having  some issues with her reflux and comfort measures were discussed.  She  had an early 1-hour Glucola that was done at 18-3/7 weeks and it was  within normal limits equal to 95.  Her RPR was nonreactive.  Hemoglobin  was 11.7 at that time.  Her urine test of cure was negative at that  visit.  Anatomy ultrasound that day.  They are expecting a female,IUP with  size consistent with previous dating.  Cervix was 4.07 cm, heart rate  was regular rate and rhythm.  Normal fluid, posterior placenta at that  time.  No anomalies were noted.  Primary risk was 1 in 4001. Her  adjusted risk was 1 in 8002.  She was referred to dermatology a soon as  possible regarding a 9 cm patch on her right lower back which had not  relieved with Nystatin cream.  The patient  has continued using a  medication for ringworm on her back.  She continued having negative  urine cultures, remained normotensive.  Her rate at  20 weeks was 246  and she was diagnosed at 70 when she went to a dermatologist in the  middle of the second trimester pityriasis rosacea.  Around 25 weeks she  reported some increase in bleeding, did have some clots but no abdominal  pain.  No recent intercourse since October and that was in March.  She  was sent to [facility] for eval on March 30 and was diagnosed with marginal  previa at that time.  Also has a large cervical fibroid. Her ultrasound  on March 30 at  [facility] [place] showed estimated fetal  weight 66 percentile, normal fluid and a positive cervical fibroid.  She  was using cortisone cream prescribed by dermatologist for the rosacea.  Had her repeat Glucola at 26 and 3 and as I mentioned was within normal  limits also.  Her  most recent ultrasound was on April 21 at [facility] [place] at maternal fetal medicine.  Placenta was noted  to  be posterior marginal previa cephalic presentation.  AFI was 21.37  cm and 86th percentile.  Estimated fetal weight 3 pounds 5 ounces 76th  percentile.  Cervical length 3 cm and that was a transvaginal length.  She did have a posterior cervical pedunculated myoma measuring 6.6 x 8 x  5.7.  They did note that marginal posterior previa persists with leading  edge 1.5 cm from internal os.  They did report that the fibroid had  grown slightly.  She did note some spotting after a bowel movement on  that day.  They did recommend a follow-up ultrasound in 3 weeks.   OBJECTIVE:  She is afebrile.  Her vital signs are stable.  Fetal heart  rate 150s to 160s, moderate variability, no decels.  Toco is without  contractions or uterine irritability.  GENERAL:  In no acute distress, alert and oriented x3.  She is very  pleasant.  She does wear glasses.  HEENT:  Within normal limits.   CARDIOVASCULAR:  Regular rate and rhythm without murmur.  LUNGS:  Clear to auscultation bilaterally.  ABDOMEN:  Soft, nontender, no fundal tenderness to speak of and is  gravid, appropriate for dates.  PELVIC:  Exam was deferred.  EXTREMITIES:  Within normal limits.   LAB WORK TODAY:  CBC was drawn, white count was normal 5.1, hemoglobin  11.4, hematocrit 32.7 and platelets were slightly low 146.   IMPRESSION:  1. Intrauterine pregnancy at 29 weeks.  2. Persisting marginal posterior previa.  3. Cervical fibroid  4. Reactive fetal heart tracing with no signs or symptoms of uterine      irritability or uterine contractions.  5. Acute episode of bleeding 5:00 p.m. on August 05, 2008.  6. Slightly low platelets.   PLAN:  1. Consulted with Dr. Pennie Rushing regarding the patient's status and CBC      as well as her bleeding.  Plan is to admit the patient to antenatal      for 23-hour observation, to continue a pad count and repeat her CBC      in the morning.  She should have iron rich diet, continuous      monitoring as well as an iron supplement.      Candice Ashford, [place]      Hal Morales, M.D.  Electronically Signed    CHS/MEDQ  D:  08/05/2008  T:  08/05/2008  Job:  [id number]

## 2010-08-28 NOTE — Discharge Summary (Signed)
NAME:  Karen Brock, Karen Brock              ACCOUNT NO.:  [id number]   MEDICAL RECORD NO.:  [id number]          PATIENT TYPE:  INP   LOCATION:  9159                          [facility]ILITY[facility]  [facility]   PHYSICIAN:  Crist Fat. Rivard, M.D. DATE OF BIRTH:  [birth year]   DATE OF ADMISSION:  08/05/2008  DATE OF DISCHARGE:  08/08/2008                               DISCHARGE SUMMARY   ADMITTING DIAGNOSES:  1. Intrauterine pregnancy at 29 weeks.  2. Third trimester vaginal bleeding.  3. Marginal placenta previa.  4. Large cervical fibroid.   DISCHARGE DIAGNOSES:  1. Intrauterine pregnancy at 29 weeks.  2. Third trimester vaginal bleeding.  3. Marginal placenta previa.  4. Large cervical fibroid.   PROCEDURE:  Betamethasone course.   HOSPITAL COURSE:  Karen Brock is a 40 year old gravida 1, para 0 at 27  weeks who was admitted on the afternoon of August 05, 2008 with an  episode of vaginal bleeding.  Her pregnancy had been remarkable for,  1. Low lying placenta with a marginal previa.  2. Large posterior cervical fibroid.  3. Advanced maternal age.  4. Previous bleeding episode.  5. Elevated BMI.  6. Seasonal asthma.  7. First trimester UTI.   On admission, the patient was having some moderate bright red bleeding.  She did have some minor wheezing noted and her respiratory status.  She  was having no contractions.  She had an ultrasound on April 19 at  [facility] showing an AFI of 21.37 which was 86th  percentile.  Estimated fetal weight of 3 pounds 5 ounces at the 76th  percentile.  Cervix was 3 cm long and there was a posterior myoma of the  cervix 6.6 x 8 x 5.7 cm which was pedunculated.  The patient was  admitted for extended monitoring.  CBC was done that was within normal  limits with a hemoglobin of 11.4, white blood cell count of 5.1, and  platelet count of 146.  The bleeding seemed to diminish itself later  that evening.  Decision was made to implement the betamethasone series  since the patient's second bleeding episode the patient had.  She  received betamethasone on April 23 and April 24.  Fetal heart rate  remained reassuring.  Ultrasound for cervical length was done April 24  showing cervical length of 2.8 cm.  It had been 3.0 on April 21 and  normal fluid.  On April 25, she was having some moderate chest  congestion and cough with yellow mucus.  She denies shortness of breath  other than her usual morning tightness related to her asthma.  She did  use her albuterol inhaler.  CBC was done that day showing a hemoglobin  of 11.3, white blood cell count of 7.9, and platelet count of 157.  Dr.  Pennie Rushing was consulted regarding the patient's status.  The decision was  made to place her on Zithromax Z-Pak secondary to her history of asthma  in light of the need to try to ensure she did not develop bronchitis.  The patient was also placed on Mucinex twice a day.  By the morning  of  April 26, the patient was doing well.  She had no bleeding for 24 hours.  Fetal heart rate was reassuring.  Uterus was nontender.  She was having  no significant contractions and fetus was active.  She was deemed to  receive full benefit of her hospital stay and was discharged home in  stable condition.   DISCHARGE MEDICATIONS:  1. Albuterol inhaler 2 puffs q.i.d. p.r.n.  2. Mucinex 600 mg p.o. b.i.d.  3. Zithromax Z-Pak will be continued for the final 3 days of 500 mg      one p.o. daily x3 days.   DISCHARGE INSTRUCTIONS:  The patient is to maintain bedrest with  bathroom privileges.  She is to be on pelvic rest with no significant  activity.  She will also monitor for any increased bleeding,  contractions, change in fetal activity, or any other issues.  Discharge  followup will occur on April 29 at 3:45 p.m., appointment with Dr.  Su Hilt.  The patient will also be out of work through this next week  initially.      Karen Brock Karen Brock, C.N.M.      Crist Fat Rivard, M.D.   Electronically Signed    VLL/MEDQ  D:  08/08/2008  T:  08/09/2008  Job:  [id number]

## 2010-08-28 NOTE — Op Note (Signed)
NAME:  Karen Brock, Karen Brock              ACCOUNT NO.:  [id number]   MEDICAL RECORD NO.:  [id number]          PATIENT TYPE:  INP   LOCATION:  9120                          [facility]ILITY:  [facility]   PHYSICIAN:  Osborn Coho, M.D.   DATE OF BIRTH:  Jun 03, 1970   DATE OF PROCEDURE:  09/30/2008  DATE OF DISCHARGE:                               OPERATIVE REPORT   PREOPERATIVE DIAGNOSES:  1. A 37 weeks.  2. Persistent marginal previa.  3. Cervical fibroid.   POSTOPERATIVE DIAGNOSES:  1. A 37 weeks.  2. Persistent marginal previa.  3. Cervical fibroid.   PROCEDURE:  Primary low transverse cesarean section.   ATTENDING:  Osborn Coho, MD   ASSISTANT:  Larna Daughters, CNM   ANESTHESIA:  Epidural.   FINDINGS:  Live female infant with Apgars of 8 at 1-minute and 9 at 5  minutes, weighing 6 pounds 15 ounces Gabrio.  Normal-appearing  bilateral ovaries and fallopian tubes.   SPECIMENS TO PATHOLOGY:  Placenta.   FLUIDS:  3000 mL.   URINE OUTPUT:  125 mL.   ESTIMATED BLOOD LOSS:  1000 mL.   COMPLICATIONS:  None.   DESCRIPTION OF PROCEDURE:  The patient was taken to the operating room  after risks, benefits, and alternatives discussed with the patient.  The  patient verbalized understanding and consent signed and witnessed.  The  patient was given an epidural per anesthesia and prepped and draped in  normal sterile fashion in the supine position.  A Pfannenstiel skin  incision was made from the underlying layer of fascia with a scalpel and  the Bovie.  The fascia was excised bilaterally in the midline, extended  bilaterally with the Mayo scissors.  Kocher clamps were placed on the  inferior aspect of fascial incision and the rectus muscle excised from  the fascia.  The same was done on the superior aspect of the fascial  incision.  The muscle separated in the midline and the peritoneum  entered bluntly and extended manually.  The bladder blade was placed and  bladder flap created with  the Metzenbaum scissors.  The uterine incision  was made with the scalpel, extended bilaterally with the bandage  scissors, membranes were ruptured, and clear fluid noted.  The infant  was delivered in vertex presentation.  The cord was clamped and cut  after delivery of the infant and the infant handed to the awaiting  pediatricians.  She had been administered 1 g of cefotetan at the start  of the case and the placenta was removed via fundal massage.  The uterus  was cleared of all clots and debris and the uterine incision repaired  with 0 Vicryl via a running interlocking stitch and a second imbricating  layer was performed.  The uterine incision was noted be hemostatic and  the bilateral adnexa as noted above.  There were no fibroids noted in  the body of the uterus, however, the posterior cervical fibroid was  palpated.  The intra-abdominal cavity was copiously irrigated.  The  peritoneum was repaired with 2-0 chromic via a running stitch.  The  fascia was repaired  with 0 Vicryl via a running stitch.  Subcutaneous  tissue was irrigated and made hemostatic with the Bovie and  reapproximated using 3 interrupted stitches of 2-0 plain.  The skin was  reapproximated using 3-0 Monocryl via a subcuticular stitch.  Half inch  Steri-Strips were applied with benzoin.  Sponge, lap, and needle count  was correct.  The patient tolerated the procedure well and is currently  awaiting transfer to the recovery room in good condition.       Osborn Coho, M.D.  Electronically Signed     AR/MEDQ  D:  09/30/2008  T:  10/01/2008  Job:  [id number]

## 2010-08-28 NOTE — H&P (Signed)
NAME:  Karen Brock, ONCALE              ACCOUNT NO.:  [id number]   MEDICAL RECORD NO.:  [id number]          PATIENT TYPE:  INP   LOCATION:  9158                          FACILITY:  [facility]   PHYSICIAN:  Hal Morales, M.D.DATE OF BIRTH:  [birth year]   DATE OF ADMISSION:  08/22/2008  DATE OF DISCHARGE:                              HISTORY & PHYSICAL   Ms. Kreger is a 40 year old gravida 1 at 31.[redacted] weeks gestation who was  followed by the doctors at [facility].  She presents today  with increased vaginal bleeding and a known placenta previa, sent over  from the office by Dr. Stefano Gaul.  Her pregnancy is remarkable for:  1. Seasonal asthma.  2. Advanced maternal age.  3. Reflux.  4. First and third trimester UTIs.  5. First and third trimester vaginal bleeding.  6. Increased BMI.  7. 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  8. Pityriasis rosacea.   PRENATAL LABS:  Karen Brock' prenatal labs include an initial hemoglobin  of 12.9, hematocrit 37.5, platelets 235,000, blood type O+, antibody  screen negative, sickle cell trait screen negative, RPR nonreactive x3,  rubella titer immune, hepatitis B negative, HIV negative.  She has had  two Glucola screens, one at 18 weeks which was normal and one at 26  weeks which was also normal.  Her two urine cultures have both grown  Proteus mirabilis.  She had a normal Pap last July.  She has had  negative gonorrhea and chlamydia testing in 2009.  Her first trimester  screen was within normal limits and she declined amniocentesis.  Her  quad screen was also within normal limits.   CURRENT MEDICATIONS:  Prenatal vitamins and albuterol inhaler.  She  recently completed a course of azithromycin due to chest congestion and  asthma problems and she takes over-the-counter stool softeners.   HISTORY OF PRESENT PREGNANCY:  Karen Brock began prenatal care in the  first trimester at around 7-8 weeks with an interview.  Her new OB exam  had to be rescheduled and was conducted at 11 weeks and 6 days.  She was  offered an amniocentesis and declined.  The Pap and cultures were not  done because they have been done earlier that year.  She did have a rash  on her back at the new OB visit and was given a prescription for  Mycolog.  She also had a UTI and was given Septra for that.  Her first  trimester screen was conducted a week later and was normal.  She had an  early Glucola due to elevated BMI and that was found to be 95 and her  hemoglobin at that time was 11.7 and a quad screen was normal.  These  were all done at 18.[redacted] weeks gestation.  At 20 weeks, her rash was  getting worse and spread all over her  neck and groin area.  She had  tried multiple over-the-counter treatments with little success and a  dermatology appointment was a arranged.  Urine culture was negative at  that time for test of cure.  At 25  weeks, she began to has of increased  vaginal bleeding and was sent to the MAU for evaluation at the end of  March.  A large cervical fibroid was noted on ultrasound and  conversation regarding optimal delivery route was then begun with  discussion of cesarean and a referral to maternal fetal medicine was  ordered.  Chlamydia and gonorrhea were negative on the visit to the MAU  on March 30 and the estimated fetal weight was in the 66th percentile.  At that point, the patient was put on bedrest and pelvic rest for the  rest of pregnancy.  She had another Glucola RPR at 27 weeks and that was  normal.  Maternal fetal medicine consult recommended cesarean and serial  ultrasounds for estimated fetal weight at 28.6 weeks.  She was admitted  on April 23 for vaginal bleeding.  She stayed for 3 days.  She got a  course of betamethasone for fetal lung maturation.  Her cervix on the  21st was 2.8 cm long.  She was discharged on April 26 on pelvic rest  with a Z-Pak for her chest congestion and on bleeding precautions.  For  a  while, she had no bleeding.  On the fifth, she had an ultrasound that  showed continuing low lying posterior placenta previa, an AFI of 12.46,  an estimated fetal weight in the 69th percentile at 3 pounds 14 ounces,  and appropriate fetal growth.  At that time, the pedunculated cervical  fibroid was measured at 7.81 cm x 7.79 cm x 7.11 cm.  She declined  transvaginal ultrasound due to having vaginal bleeding after the  previous transvaginal ultrasound.  She has another urinary tract  infection per culture done on May 5 growing Proteus mirabilis.  She  began to have cervical spotting overnight and was seen in the office  today with Dr. Stefano Gaul who sent her over for increased vaginal bleeding  after a reactive NST and ultrasound in the office which showed a  cervical length 3.26 cm, amniotic fluid index 80th percentile at 20.98  cm few centimeters, fetus in the vertex position and placenta previa and  cervical fibroids showing no change.   OBSTETRICAL HISTORY:  Karen Brock has had no other pregnancies.   ALLERGIES:  She has an allergy to shell fish that causes throat itching  and lip swelling.  She states Betadine is okay.  She does not have any  medication or latex allergies.   MEDICAL HISTORY:  Ms. Carrell began menstruation at 40 years of age with  cycles that last 3-4 days and occur every 20 days.  Her birth control  history includes NuvaRing, Levora, and over-the-counter products.  She  does have a history of an abnormal Pap but her last Pap was normal in  July 2009.  As mentioned, she does have a large pedunculated fibroid in  her cervix.  She has a very remote history of Chlamydia and condyloma.  She had the usual childhood illnesses including chickenpox.  She has  asthma which is seasonal and well controlled with an albuterol inhaler.  She states it was much worse when she was a child.  She has gastric  reflux but is on any current medications for that.  She does suffer from   constipation and manages that with over-the-counter products.  She  occasionally has urinary tract infections though not often.  She has had  two this pregnancy at her new OB and this past week and then she has  had  new diagnosis of pityriasis rosacea with a rash on her back which spread  to other places.   PAST SURGICAL HISTORY:  Surgical history includes right foot bunion  surgery in 2004.   FAMILY HISTORY:  Mom and maternal grandmother have heart disease.  Mom  and dad both have chronic hypertension and maternal aunt has varicose  veins.  Maternal aunt and maternal first cousin have epilepsy.  A  paternal first cousin has HIV.  Paternal grandfather has pancreatic  cancer.  Maternal first cousin has prostate cancer.  Maternal  grandfather is an alcoholic.   GENETIC HISTORY:  The patient is of advanced maternal age, declined  amniocentesis, did have a normal first trimester screen, a normal quad  screen, and has a negative sickle cell trait screen.  There is no other  contributory genetic information on the patient or the father of baby.   SOCIAL HISTORY:  Ms. Torain is a single black female with a master's  degree and works as a Doctor, general practice.  The father of the baby is  listed as Pharmacologist.  He has a bachelor's degree and works as a  Surveyor, minerals.  Ms. Gernert does report minimal wine consumption in  the first trimester before discovering that she was pregnant.  Denies  use of tobacco or street drugs during the pregnancy.  There is no stated  religion on her prenatal records.   PHYSICAL EXAMINATION:  She is afebrile with stable vital signs.  GENERAL:  No apparent distress, alert and oriented x3, very pleasant.  HEENT: Within normal limits.  HEART:  Regular rate and rhythm.  No  murmurs.  LUNGS:  Clear to auscultation bilaterally.  ABDOMEN:  Soft,  nontender, gravid, appropriate for gestational age.  PELVIC EXAM:  Deferred due to bleeding issues and recent pelvic  exam in the office.  EXTREMITIES:  Within normal limits with minimal edema and normal DTRs.  Negative Homans' sign.  Fetal heart rate is normal at 145, reactive,  reassuring, no decelerations, moderate variability present.  Uterus is  calm without irritability or contractions per toco and palpation.   IMPRESSION:  Intrauterine pregnancy at 31.37 weeks, known marginal  placenta previa, vaginal bleeding, status post betamethasone course on  her previous admission April 23 to April 26, reassuring fetal heart rate  with no signs of preterm labor, urinary tract infection per urinalysis.   PLAN:  Per consultation with Dr. Pennie Rushing, admission to antenatal unit  for continued observation and bleeding observation, Ancef 2 grams IV q.8  for UTI, CBC with IV stick pending, continuous monitoring, and albuterol  inhaler at bedside p.r.n.      Eulogio Bear, CNM      Hal Morales, M.D.  Electronically Signed    JM/MEDQ  D:  08/22/2008  T:  08/22/2008  Job:  [id number]

## 2010-10-19 ENCOUNTER — Encounter (INDEPENDENT_AMBULATORY_CARE_PROVIDER_SITE_OTHER): Payer: Self-pay

## 2010-11-09 ENCOUNTER — Ambulatory Visit (INDEPENDENT_AMBULATORY_CARE_PROVIDER_SITE_OTHER): Payer: BC Managed Care – PPO | Admitting: Physician Assistant

## 2010-11-09 ENCOUNTER — Encounter (INDEPENDENT_AMBULATORY_CARE_PROVIDER_SITE_OTHER): Payer: Self-pay

## 2010-11-09 VITALS — BP 114/72 | Ht 63.0 in | Wt 222.2 lb

## 2010-11-09 DIAGNOSIS — Z4651 Encounter for fitting and adjustment of gastric lap band: Secondary | ICD-10-CM

## 2010-11-09 NOTE — Patient Instructions (Signed)
Take clear liquids for the next 48 hours. Thin protein shakes are ok to start on Saturday evening. Call us if you have persistent vomiting or regurgitation, night cough or reflux symptoms. Return as scheduled or sooner if you notice no changes in hunger/portion sizes.   

## 2010-11-09 NOTE — Progress Notes (Signed)
  HISTORY: Karen Brock is a 40 y.o.female who received an AP-Standard lap-band in November 2011 by Dr. Ezzard Standing. She denies any untoward symptoms this morning but says her hunger is significant and portion sizes are fairly large. She is engaged in an exercise program. She would like an adjustment today to stay on track.  VITAL SIGNS: Filed Vitals:   11/09/10 1150  BP: 114/72    PHYSICAL EXAM: Physical exam reveals a very well-appearing 40 y.o.female in no apparent distress Neurologic: Awake, alert, oriented Psych: Bright affect, conversant Respiratory: Breathing even and unlabored. No stridor or wheezing Abdomen: Soft, nontender, nondistended to palpation. Incisions well-healed. No incisional hernias. Port easily palpated. Extremities: Atraumatic, good range of motion.  ASSESMENT: 40 y.o.  female  s/p AP-Standard lap-band. She could benefit from an adjustment today.  PLAN: The patient's port was accessed with a 20G Huber needle without difficulty. Clear fluid was aspirated and .5 mL saline was added to the port to give a total volume of 4.2 mL. The patient was able to swallow water without difficulty following the procedure and was instructed to take clear liquids for the next 24-48 hours and advance slowly as tolerated.

## 2010-11-12 ENCOUNTER — Other Ambulatory Visit: Payer: Self-pay | Admitting: Obstetrics and Gynecology

## 2010-11-12 DIAGNOSIS — N6310 Unspecified lump in the right breast, unspecified quadrant: Secondary | ICD-10-CM

## 2010-11-20 ENCOUNTER — Other Ambulatory Visit: Payer: Self-pay

## 2010-11-29 ENCOUNTER — Ambulatory Visit
Admission: RE | Admit: 2010-11-29 | Discharge: 2010-11-29 | Disposition: A | Discharge status: Home / Self Care | Payer: BC Managed Care – PPO | Source: Ambulatory Visit | Attending: Obstetrics and Gynecology | Admitting: Obstetrics and Gynecology

## 2010-11-29 ENCOUNTER — Other Ambulatory Visit: Payer: Self-pay | Admitting: Obstetrics and Gynecology

## 2010-11-29 DIAGNOSIS — N6310 Unspecified lump in the right breast, unspecified quadrant: Secondary | ICD-10-CM

## 2010-11-29 DIAGNOSIS — N63 Unspecified lump in unspecified breast: Secondary | ICD-10-CM

## 2010-12-06 ENCOUNTER — Encounter (INDEPENDENT_AMBULATORY_CARE_PROVIDER_SITE_OTHER): Payer: Self-pay | Admitting: General Surgery

## 2010-12-07 ENCOUNTER — Encounter (INDEPENDENT_AMBULATORY_CARE_PROVIDER_SITE_OTHER): Payer: BC Managed Care – PPO

## 2010-12-25 ENCOUNTER — Encounter: Payer: BC Managed Care – PPO | Attending: Surgery | Admitting: *Deleted

## 2010-12-25 ENCOUNTER — Encounter: Payer: Self-pay | Admitting: *Deleted

## 2010-12-25 DIAGNOSIS — Z09 Encounter for follow-up examination after completed treatment for conditions other than malignant neoplasm: Secondary | ICD-10-CM | POA: Insufficient documentation

## 2010-12-25 DIAGNOSIS — Z713 Dietary counseling and surveillance: Secondary | ICD-10-CM | POA: Insufficient documentation

## 2010-12-25 DIAGNOSIS — Z9884 Bariatric surgery status: Secondary | ICD-10-CM | POA: Insufficient documentation

## 2010-12-25 NOTE — Progress Notes (Signed)
  Follow-up visit: 10 Months Post-Operative LAGB Surgery  Medical Nutrition Therapy:  Appt start time: 1800 end time:  1830.  Assessment:  Primary concerns today: post-operative bariatric surgery nutrition management. Pt reports that she is not eating the right foods and is really anxious to get weight off fast. Pt has not been seen at [facility] since January 2012  Weight today: 222.5 Weight change: 10 lbs Total weight lost: 26.8 lbs total BMI: 39.5% Weight goal: 170 lbs  24-hr recall:  B (7:30 AM): Sausage, egg, 1/2 english muffin OR Protein Shake (Unjury) Snk (10 AM): [place] yogurt   L (12:30 PM): Leftovers from dinner Snk (3 PM): Lance PB crackers OR Yopliat "Whips" yogurt  D (5:30 PM): Protein shake OR Ground [place] w/ mixed vegetables, potatoes Snk (7-9 PM): Bowl of cereal OR Yogurt cup  Fluid intake: Coffee, Protein shake, water, Almond milk = 30-50 oz Estimated total protein intake: 60g  Medications: Only on an oral contraceptive at this time Supplementation: Pt does not regularly take supplements  Using straws: No Drinking while eating: Yes, 10-12 oz/meal Hair loss: No Carbonated beverages: No N/V/D/C: No Dumping syndrome: No Last Lap-Band fill: Last fill per Mardelle Matte on 11/09/10  Recent physical activity:  Walking (some?) 1-2 times/week: "I'm not exercising regularly"  Progress Towards Goal(s):  In progress.   Nutritional Diagnosis:  [place]-3.3 Overweight/obesity As related to recent LAGB surgery.  As evidenced by pt attempting to follow LAGB dietary guidelines for continued weight loss.    Intervention:  Nutrition education.  Monitoring/Evaluation:  Dietary intake, exercise, lap band fills, and body weight. Follow up in 2 months for 12 month post-op visit.

## 2010-12-25 NOTE — Patient Instructions (Signed)
Goals:  Follow Phase 3B: High Protein + Non-Starchy Vegetables  Eat 3-6 small meals/snacks, every 3-5 hrs  Increase lean protein foods to meet 60-80g goal  Increase fluid intake to 64oz +  Add 15 grams of carbohydrate (fruit, whole grain, starchy vegetable) with meals  Avoid drinking 15 minutes before, during and 30 minutes after eating  Aim for >30 min of physical activity daily  

## 2011-02-27 ENCOUNTER — Encounter (INDEPENDENT_AMBULATORY_CARE_PROVIDER_SITE_OTHER): Payer: Self-pay | Admitting: Surgery

## 2011-03-01 ENCOUNTER — Encounter (INDEPENDENT_AMBULATORY_CARE_PROVIDER_SITE_OTHER): Payer: BC Managed Care – PPO

## 2011-03-14 ENCOUNTER — Encounter (INDEPENDENT_AMBULATORY_CARE_PROVIDER_SITE_OTHER): Payer: Self-pay | Admitting: Physician Assistant

## 2011-06-06 DIAGNOSIS — IMO0001 Reserved for inherently not codable concepts without codable children: Secondary | ICD-10-CM

## 2011-06-06 DIAGNOSIS — R896 Abnormal cytological findings in specimens from other organs, systems and tissues: Secondary | ICD-10-CM | POA: Insufficient documentation

## 2011-06-06 HISTORY — DX: Reserved for inherently not codable concepts without codable children: IMO0001

## 2011-09-27 ENCOUNTER — Telehealth: Payer: Self-pay | Admitting: Obstetrics and Gynecology

## 2011-09-27 NOTE — Telephone Encounter (Signed)
TC to pt.  States had menses is May lasting 3 weeks.  None in June at this point.   Neg UPT x 3.  Is using barrier method for contraception.  Has been having hot flashes x 1 week. Sched for eval with EP.

## 2011-09-27 NOTE — Telephone Encounter (Signed)
Triage/cht received 

## 2011-10-02 ENCOUNTER — Ambulatory Visit (INDEPENDENT_AMBULATORY_CARE_PROVIDER_SITE_OTHER): Payer: BC Managed Care – PPO | Admitting: Obstetrics and Gynecology

## 2011-10-02 ENCOUNTER — Encounter: Payer: Self-pay | Admitting: Obstetrics and Gynecology

## 2011-10-02 VITALS — BP 114/70 | Temp 97.6°F | Ht 63.0 in | Wt 214.0 lb

## 2011-10-02 DIAGNOSIS — R87610 Atypical squamous cells of undetermined significance on cytologic smear of cervix (ASC-US): Secondary | ICD-10-CM

## 2011-10-02 DIAGNOSIS — A499 Bacterial infection, unspecified: Secondary | ICD-10-CM

## 2011-10-02 DIAGNOSIS — J45909 Unspecified asthma, uncomplicated: Secondary | ICD-10-CM

## 2011-10-02 DIAGNOSIS — B9689 Other specified bacterial agents as the cause of diseases classified elsewhere: Secondary | ICD-10-CM

## 2011-10-02 DIAGNOSIS — N926 Irregular menstruation, unspecified: Secondary | ICD-10-CM

## 2011-10-02 DIAGNOSIS — Z8619 Personal history of other infectious and parasitic diseases: Secondary | ICD-10-CM | POA: Insufficient documentation

## 2011-10-02 DIAGNOSIS — Z87440 Personal history of urinary (tract) infections: Secondary | ICD-10-CM

## 2011-10-02 DIAGNOSIS — O2340 Unspecified infection of urinary tract in pregnancy, unspecified trimester: Secondary | ICD-10-CM

## 2011-10-02 DIAGNOSIS — A749 Chlamydial infection, unspecified: Secondary | ICD-10-CM | POA: Insufficient documentation

## 2011-10-02 DIAGNOSIS — O26851 Spotting complicating pregnancy, first trimester: Secondary | ICD-10-CM

## 2011-10-02 DIAGNOSIS — B977 Papillomavirus as the cause of diseases classified elsewhere: Secondary | ICD-10-CM

## 2011-10-02 DIAGNOSIS — O209 Hemorrhage in early pregnancy, unspecified: Secondary | ICD-10-CM

## 2011-10-02 DIAGNOSIS — R6889 Other general symptoms and signs: Secondary | ICD-10-CM

## 2011-10-02 DIAGNOSIS — IMO0002 Reserved for concepts with insufficient information to code with codable children: Secondary | ICD-10-CM | POA: Insufficient documentation

## 2011-10-02 DIAGNOSIS — K219 Gastro-esophageal reflux disease without esophagitis: Secondary | ICD-10-CM

## 2011-10-02 DIAGNOSIS — N912 Amenorrhea, unspecified: Secondary | ICD-10-CM

## 2011-10-02 DIAGNOSIS — N390 Urinary tract infection, site not specified: Secondary | ICD-10-CM

## 2011-10-02 DIAGNOSIS — N63 Unspecified lump in unspecified breast: Secondary | ICD-10-CM

## 2011-10-02 DIAGNOSIS — R928 Other abnormal and inconclusive findings on diagnostic imaging of breast: Secondary | ICD-10-CM

## 2011-10-02 DIAGNOSIS — O2390 Unspecified genitourinary tract infection in pregnancy, unspecified trimester: Secondary | ICD-10-CM

## 2011-10-02 DIAGNOSIS — O26859 Spotting complicating pregnancy, unspecified trimester: Secondary | ICD-10-CM

## 2011-10-02 DIAGNOSIS — N760 Acute vaginitis: Secondary | ICD-10-CM

## 2011-10-02 LAB — CBC
MCH: 29.5 pg (ref 26.0–34.0)
MCHC: 31.7 g/dL (ref 30.0–36.0)
Platelets: 248 10*3/uL (ref 150–400)
RDW: 12.7 % (ref 11.5–15.5)

## 2011-10-02 LAB — PROLACTIN: Prolactin: 9.1 ng/mL

## 2011-10-02 LAB — POCT URINE PREGNANCY: Preg Test, Ur: NEGATIVE

## 2011-10-02 NOTE — Progress Notes (Signed)
When did bleeding start: may How  Long: spotting 2 weeks after cycle How often changing pad/tampon: after cycle 1 or 2 x a day Bleeding Disorders: no Contraception: nopt use condoms Fibroids: yes hx Hormone Therapy: no New Medications: no Menopausal Symptoms: yes hot flashes Vag. Discharge: no Abdominal Pain: no Increased Stress: no

## 2011-10-02 NOTE — Progress Notes (Signed)
40 YO  reports spotting x 2 weeks following May menses.  Per 06/06/11 visit with Dr. Su Hilt, patient is to have an U/S and labs if symptoms persists irregular bleeding persisted as she had been having some spotting prior to that visit.. States she's been off of pills since February.  Has not had a period in June and had only 3/10 cramps with the bleeding that she had (changed pads 3 times a day).  Has a history of cervical fibroid that was removed June 2011.  Denies post-coital bleeding, urinary tract symptoms, changes in bowel movements, nipple discharge, or vision changes.   O: Abdomen: soft, no tenderness       Pelvic: EGBUS-wnl, vagina-normal rugae, uterus-normal       size, adnexae-no masses or tenderness  UPT-negative  A: Irregular menses     Amenorrhea     H/O Cervical Polyp  P: Per Dr. Su Hilt:  U/S to evaluate pelvis     TSH, CBC and Prolactin      RTO-Ultrasound and follow up

## 2011-10-02 NOTE — Progress Notes (Signed)
Pt had a cycle in may;no cycle in June and the cycle in May was regular then spotted 2 weeks after

## 2011-10-24 ENCOUNTER — Encounter: Payer: BC Managed Care – PPO | Admitting: Obstetrics and Gynecology

## 2011-10-24 ENCOUNTER — Other Ambulatory Visit: Payer: BC Managed Care – PPO

## 2011-10-28 ENCOUNTER — Ambulatory Visit (INDEPENDENT_AMBULATORY_CARE_PROVIDER_SITE_OTHER): Payer: BC Managed Care – PPO

## 2011-10-28 ENCOUNTER — Ambulatory Visit (INDEPENDENT_AMBULATORY_CARE_PROVIDER_SITE_OTHER): Payer: BC Managed Care – PPO | Admitting: Obstetrics and Gynecology

## 2011-10-28 ENCOUNTER — Encounter: Payer: Self-pay | Admitting: Obstetrics and Gynecology

## 2011-10-28 VITALS — BP 112/70 | HR 74 | Wt 228.0 lb

## 2011-10-28 DIAGNOSIS — N926 Irregular menstruation, unspecified: Secondary | ICD-10-CM

## 2011-10-28 MED ORDER — NYSTATIN-TRIAMCINOLONE 100000-0.1 UNIT/GM-% EX OINT: TOPICAL_OINTMENT | Freq: Two times a day (BID) | CUTANEOUS | Status: DC

## 2011-10-28 NOTE — Progress Notes (Signed)
41 YO seen June 19th for spotting x 2 weeks following May period.  Had had previous irregular bleeding with plan, per Dr. Su Hilt for a follow up ultrasound for persistence.   Reports no period in June and a normal period this month starting July 3-6, 2013  with pad change 4 times a day.  Here for ultrasound follow up.  O:  U/S uterus- 7.80 x 3.69 x 3.78 cm with endometrium 8.84 mm with blood flow (cannot rule out polyp) ovaries appear normal  A:  Irregular Menses     Thickened Endometrium with blood flow  P: After consult with Dr. Su Hilt, patient was given the options of: repeat U/S after next period (within 5-7 d),     SHG or HSC with D & C.  Patient has not completed her child bearing so would not want ablation but she     prefers to proceed with [facility] D & C      Overview given of hysteroscopy, dilatation and curettage       Chart to Dr. Su Hilt to schedule  Karen Leber, PA-C

## 2011-11-05 ENCOUNTER — Telehealth: Payer: Self-pay | Admitting: Obstetrics and Gynecology

## 2011-11-05 NOTE — Telephone Encounter (Signed)
In office Hysteroscopy scheduled for 11/26/11 @ 9:30 with AR.  BCBS effective 10/14/11.Marland Kitchen Plan pays 100% after a $35 copayment per [place] Ref# [id number]. -Adrianne Pridgen

## 2011-11-11 ENCOUNTER — Encounter: Payer: Self-pay | Admitting: Obstetrics and Gynecology

## 2011-11-12 ENCOUNTER — Other Ambulatory Visit: Payer: Self-pay | Admitting: Obstetrics and Gynecology

## 2011-11-12 DIAGNOSIS — Z1231 Encounter for screening mammogram for malignant neoplasm of breast: Secondary | ICD-10-CM

## 2011-11-19 ENCOUNTER — Encounter: Payer: Self-pay | Admitting: Obstetrics and Gynecology

## 2011-11-19 ENCOUNTER — Ambulatory Visit (INDEPENDENT_AMBULATORY_CARE_PROVIDER_SITE_OTHER): Payer: BC Managed Care – PPO | Admitting: Obstetrics and Gynecology

## 2011-11-19 VITALS — BP 100/74 | Resp 14 | Ht 63.0 in | Wt 227.0 lb

## 2011-11-19 DIAGNOSIS — N949 Unspecified condition associated with female genital organs and menstrual cycle: Secondary | ICD-10-CM

## 2011-11-19 DIAGNOSIS — N840 Polyp of corpus uteri: Secondary | ICD-10-CM

## 2011-11-19 DIAGNOSIS — N898 Other specified noninflammatory disorders of vagina: Secondary | ICD-10-CM

## 2011-11-19 LAB — POCT WET PREP (WET MOUNT): pH: 5.5

## 2011-11-19 MED ORDER — TINIDAZOLE 500 MG PO TABS: 2.0000 g | ORAL_TABLET | Freq: Every day | ORAL | Status: AC

## 2011-11-19 MED ORDER — DIAZEPAM 10 MG PO TABS: 10.0000 mg | ORAL_TABLET | Freq: Once | ORAL | Status: AC

## 2011-11-19 MED ORDER — HYDROCODONE-ACETAMINOPHEN 5-500 MG PO TABS: 1.0000 | ORAL_TABLET | Freq: Four times a day (QID) | ORAL | Status: AC | PRN

## 2011-11-19 MED ORDER — IBUPROFEN 800 MG PO TABS: 800.0000 mg | ORAL_TABLET | Freq: Three times a day (TID) | ORAL | Status: AC | PRN

## 2011-11-19 MED ORDER — PROMETHAZINE HCL 25 MG PO TABS
25.0000 mg | ORAL_TABLET | Freq: Four times a day (QID) | ORAL | Status: DC | PRN
Start: 2011-11-19 — End: 2012-10-15

## 2011-11-19 NOTE — Addendum Note (Signed)
Addended by: Marla Roe A on: 11/19/2011 12:19 PM   Modules accepted: Orders

## 2011-11-19 NOTE — Progress Notes (Addendum)
U/S uterus- 7.80 x 3.69 x 3.78 cm with endometrium 8.84 mm with blood flow (cannot rule out polyp) ovaries appear normal Reviewed the procedure  Filed Vitals:   11/19/11 1130  BP: 100/74  Resp: 14   ROS: noncontributory  Pelvic exam:  VULVA: normal appearing vulva with no masses, tenderness or lesions,  VAGINA: normal appearing vagina with normal color and discharge, no lesions, white d/c CERVIX: normal appearing cervix without discharge or lesions,  UTERUS: uterus is normal size, shape, consistency and nontender,  ADNEXA: normal adnexa in size, nontender and no masses.  Results for orders placed in visit on 11/19/11  POCT WET PREP (WET MOUNT)      Component Value Range   Source Wet Prep POC       WBC, Wet Prep HPF POC       Bacteria Wet Prep HPF POC mod     BACTERIA WET PREP MORPHOLOGY POC       Clue Cells Wet Prep HPF POC Moderate     CLUE CELLS WET PREP WHIFF POC Positive Whiff     Yeast Wet Prep HPF POC None     KOH Wet Prep POC       Trichomonas Wet Prep HPF POC neg     pH 5.5      A/P BV - Rx Tindamax Rxs given as well as instructions Keep appt for in office Hyst/D&C on 8/13 Questions answered

## 2011-11-19 NOTE — Patient Instructions (Signed)
To: Karen Brock  DOB: 09/20/1970   Date: 11/19/2011   Patient Instructions for in office Hysteroscopy  Please read several days prior to your procedure and follow the instructions.  If you have any questions, please contact the office.  1.  Pre-procedure and Post-procedure medications:  Pick up Motrin, Valium, Phenergan, and Vicodin from your pharmacy  Bring medications to the office when you come in for your procedure  2.  Begin taking Motrin 800mg  48 hours prior to procedure.  Take 1 tablet every 8 hours.  **Please notify doctor and do not take if you have a history of stomach ulcers or have been notified by a physician that you cannot take Motrin or Ibuprofen.  3.  Bring someone to your appointment who can be with you in the preparation area for the hour prior to your procedure, and who can drive you home afterward.  You will be at the office approximately 2 to 3 hours.  No driving within the first 24 hours or while still taking narcotic pain medicine.  4.  Wear comfortable, loose fitting clothing.  5.  DAY OF PROCEDURE:  No breakfast.  No Motrin the morning of the procedure.  No lunch until after your procedure.  Don't forget to bring your medications.  Drink plenty of fluids prior to your procedure and throughout the day.  No driving for 24 hours.  Bring someone with you the day of the appointment that can drive you home.  6.  Following the procedure:  You may experience cramping for 12 - 24 hours after treatment.  Take Motrin every 8 hours as prescribed for 3 doses today, and/Vicodin for breakthrough pain every 4 hours as needed.  You may resume normal activities tomorrow.  You may resume sexual intercourse in 1 week or after discharge has completely resolved.  You may resume using tampons tomorrow.  Expect to experience vaginal discharge after the treatment for 6 - 10 days.  It is described as bloody during the first few days; pinkish by approximately one week; then  profuse and watery thereafter.  If not already scheduled, please schedule your follow-up appointment 1 - 2 weeks from today.

## 2011-11-26 ENCOUNTER — Ambulatory Visit (INDEPENDENT_AMBULATORY_CARE_PROVIDER_SITE_OTHER): Payer: BC Managed Care – PPO | Admitting: Obstetrics and Gynecology

## 2011-11-26 ENCOUNTER — Other Ambulatory Visit: Payer: Self-pay

## 2011-11-26 ENCOUNTER — Encounter: Payer: Self-pay | Admitting: Obstetrics and Gynecology

## 2011-11-26 VITALS — BP 112/70 | HR 64 | Resp 16 | Wt 226.0 lb

## 2011-11-26 DIAGNOSIS — N840 Polyp of corpus uteri: Secondary | ICD-10-CM

## 2011-11-26 DIAGNOSIS — N921 Excessive and frequent menstruation with irregular cycle: Secondary | ICD-10-CM | POA: Insufficient documentation

## 2011-11-26 MED ORDER — KETOROLAC TROMETHAMINE 60 MG/2ML IM SOLN
60.0000 mg | Freq: Once | INTRAMUSCULAR | Status: AC
  Administered 2011-11-26: 60 mg via INTRAMUSCULAR

## 2011-11-26 MED ORDER — KETOROLAC TROMETHAMINE 60 MG/2ML IM SOLN: 60.0000 mg | Freq: Once | INTRAMUSCULAR | Status: DC

## 2011-11-26 NOTE — Progress Notes (Signed)
Patient ID: Karen Brock, female   DOB: 08-25-1970, 41 y.o.   MRN: [id number]

## 2011-11-26 NOTE — Addendum Note (Signed)
Addended by: Marla Roe A on: 11/26/2011 04:58 PM   Modules accepted: Orders

## 2011-11-26 NOTE — Progress Notes (Signed)
Valium 10mg , Vicodin 5/500, Phenergan 25mg  taken at 9:10 am.  Toradol 60mg  given at 9:23.  UPT negative.by LD  DIAGNOSTIC AND/OR OPERATIVE IN-OFFICE HYSTEROSCOPY PROCEDURE    Patient's Name: Karen Brock  Date of Birth: 05-02-1970  Date of Procedure: 11/26/2011     Preoperative Diagnosis: metrorrhagia and possible polyp  Postoperative Diagnosis: metrorrhagia and same  Name of Operation/Procedure: 1.Hysteroscopy 2.D&C  Estimated Hysteroscopic Fluid Deficit: Minimal (25cc)  EBL: Minimal   ZOX:WRUEAV prior to procedure   Procedure: The patient was placed in the dorsal lithotomy position.  She was sterilely prepped and draped.  A Grave's speculum was placed in the vaginal vault.  The anterior lip of the cervix was grasped with a single tooth tenaculum.  Using Lidocaine:Lidocaine with Epinephrine 10 ml at a ratio of 3:1, a paracervical block was placed in the sacrouterine pillars as we near the right and left parametrial areas.  Additionally, injection sites were placed circumferentially around the ectocervix: approximately 10 ml was used.  A uterine dilator was then used to dilate the internal cervical os.  The uterus was sounded to 7 cm.  The Slimline Hysteroscope was introduced with NS as the distention media.  The findings were fluffy endometrium.  Sharp curettage was performed.  If sharp curettage was performed, the specimen was sent to pathology accordingly.  The device was retracted and safely removed from the cavity.  The instruments were removed.  Sponge and needle counts were correct at the completion of the procedure.  She tolerated the procedure well and was discharged home without complication.  Purcell Nails

## 2011-11-28 LAB — PATHOLOGY

## 2011-12-02 ENCOUNTER — Ambulatory Visit: Payer: BC Managed Care – PPO

## 2011-12-10 ENCOUNTER — Encounter: Payer: BC Managed Care – PPO | Admitting: Obstetrics and Gynecology

## 2011-12-24 ENCOUNTER — Encounter: Payer: BC Managed Care – PPO | Admitting: Obstetrics and Gynecology

## 2011-12-27 ENCOUNTER — Ambulatory Visit
Admission: RE | Admit: 2011-12-27 | Discharge: 2011-12-27 | Disposition: A | Discharge status: Home / Self Care | Payer: BC Managed Care – PPO | Source: Ambulatory Visit | Attending: Obstetrics and Gynecology | Admitting: Obstetrics and Gynecology

## 2011-12-27 DIAGNOSIS — Z1231 Encounter for screening mammogram for malignant neoplasm of breast: Secondary | ICD-10-CM

## 2011-12-31 ENCOUNTER — Encounter: Payer: Self-pay | Admitting: Obstetrics and Gynecology

## 2011-12-31 ENCOUNTER — Ambulatory Visit (INDEPENDENT_AMBULATORY_CARE_PROVIDER_SITE_OTHER): Payer: BC Managed Care – PPO | Admitting: Obstetrics and Gynecology

## 2011-12-31 VITALS — BP 120/62 | Ht 63.0 in | Wt 230.0 lb

## 2011-12-31 DIAGNOSIS — R232 Flushing: Secondary | ICD-10-CM

## 2011-12-31 DIAGNOSIS — N951 Menopausal and female climacteric states: Secondary | ICD-10-CM

## 2011-12-31 DIAGNOSIS — N920 Excessive and frequent menstruation with regular cycle: Secondary | ICD-10-CM

## 2011-12-31 DIAGNOSIS — Z309 Encounter for contraceptive management, unspecified: Secondary | ICD-10-CM

## 2011-12-31 NOTE — Progress Notes (Signed)
DATE OF SURGERY: 11/26/2011  TYPE OF SURGERY:Hysteroscopy, D&C PAIN:No VAG BLEEDING: no VAG DISCHARGE: no NORMAL GI FUNCTN: yes NORMAL GU FUNCTN: yes  Path neg S/p in office hyst/D&C sec to ?polyps and metrorrhagia C/o heavy and prolonged cycle 8/31, fatigue and mood swings  Filed Vitals:   12/31/11 1526  BP: 120/62   ROS: noncontributory  Pelvic exam:  VULVA: normal appearing vulva with no masses, tenderness or lesions,  VAGINA: normal appearing vagina with normal color and discharge, no lesions, CERVIX: normal appearing cervix without discharge or lesions,  UTERUS: uterus is normal size, shape, consistency and nontender,  ADNEXA: normal adnexa in size, nontender and no masses.  TSH nl recently  A/P Path discussed Options and recs reviewed GC/CT with consent If abnl/heavy bleeding persists next cycle, I rec Mirena that next cycle.  Pt will obs and sched as needed. March for AEX Check CBC and [facility]

## 2012-01-01 LAB — GC/CHLAMYDIA PROBE AMP, GENITAL: Chlamydia, DNA Probe: NEGATIVE

## 2012-01-01 LAB — CBC
Hemoglobin: 13.1 g/dL (ref 12.0–15.0)
MCHC: 33.3 g/dL (ref 30.0–36.0)
Platelets: 271 10*3/uL (ref 150–400)
RDW: 13.5 % (ref 11.5–15.5)

## 2012-01-01 LAB — FOLLICLE STIMULATING HORMONE: FSH: 85.8 m[IU]/mL

## 2012-01-08 ENCOUNTER — Telehealth: Payer: Self-pay

## 2012-01-08 NOTE — Telephone Encounter (Signed)
Spoke to pt to explain labs. Pt understands and will let me know when she goes  3 months straight with no menses while off the pills. Pt has been off the pills for 6+ months, but did have a period the end of Aug. This yr.  Right now the pt is Peri-menopausal.  I will wait to hear more from the pt  In re: to her cycles, so that we can follow Dr. Su Hilt plan of care. Pt voiced understanding. Melody Comas A

## 2012-10-15 ENCOUNTER — Encounter (INDEPENDENT_AMBULATORY_CARE_PROVIDER_SITE_OTHER): Payer: Self-pay

## 2012-10-15 ENCOUNTER — Ambulatory Visit (INDEPENDENT_AMBULATORY_CARE_PROVIDER_SITE_OTHER): Payer: BC Managed Care – PPO | Admitting: Physician Assistant

## 2012-10-15 VITALS — BP 112/68 | HR 74 | Temp 97.2°F | Resp 18 | Ht 63.0 in | Wt 235.0 lb

## 2012-10-15 DIAGNOSIS — Z9889 Other specified postprocedural states: Secondary | ICD-10-CM | POA: Insufficient documentation

## 2012-10-15 DIAGNOSIS — Z4651 Encounter for fitting and adjustment of gastric lap band: Secondary | ICD-10-CM

## 2012-10-15 DIAGNOSIS — Z9884 Bariatric surgery status: Secondary | ICD-10-CM

## 2012-10-15 NOTE — Patient Instructions (Signed)

## 2012-10-15 NOTE — Progress Notes (Signed)
  HISTORY: Karen Brock is a 42 y.o.female who received an AP-Standard lap-band in November 2011 by Dr. Ezzard Standing. She comes in with increased weight, hunger and portion size since her last visit two years ago. She's re-engaged in an exercise program. She's ready to get back on track with weight loss. She reports difficulty with bread but other foods give her little trouble. She would like a fill today.  VITAL SIGNS: Filed Vitals:   10/15/12 1418  BP: 112/68  Pulse: 74  Temp: 97.2 F (36.2 C)  Resp: 18    PHYSICAL EXAM: Physical exam reveals a very well-appearing 42 y.o.female in no apparent distress Neurologic: Awake, alert, oriented Psych: Bright affect, conversant Respiratory: Breathing even and unlabored. No stridor or wheezing Abdomen: Soft, nontender, nondistended to palpation. Incisions well-healed. No incisional hernias. Port easily palpated. Extremities: Atraumatic, good range of motion.  ASSESMENT: 42 y.o.  female  s/p AP-Standard lap-band.   PLAN: The patient's port was accessed with a 20G Huber needle without difficulty. Clear fluid was aspirated and 0.5 mL saline was added to the port to give a total predicted volume of 4.7 mL. The patient was able to swallow water without difficulty following the procedure and was instructed to take clear liquids for the next 24-48 hours and advance slowly as tolerated.

## 2012-10-22 ENCOUNTER — Encounter (INDEPENDENT_AMBULATORY_CARE_PROVIDER_SITE_OTHER): Payer: BC Managed Care – PPO

## 2012-12-03 ENCOUNTER — Encounter (INDEPENDENT_AMBULATORY_CARE_PROVIDER_SITE_OTHER): Payer: BC Managed Care – PPO

## 2012-12-08 ENCOUNTER — Other Ambulatory Visit: Payer: Self-pay

## 2012-12-08 DIAGNOSIS — Z1231 Encounter for screening mammogram for malignant neoplasm of breast: Secondary | ICD-10-CM

## 2012-12-28 ENCOUNTER — Ambulatory Visit
Admission: RE | Admit: 2012-12-28 | Discharge: 2012-12-28 | Disposition: A | Discharge status: Home / Self Care | Payer: BC Managed Care – PPO | Source: Ambulatory Visit

## 2012-12-28 DIAGNOSIS — Z1231 Encounter for screening mammogram for malignant neoplasm of breast: Secondary | ICD-10-CM

## 2012-12-31 ENCOUNTER — Encounter (INDEPENDENT_AMBULATORY_CARE_PROVIDER_SITE_OTHER): Payer: Self-pay

## 2012-12-31 ENCOUNTER — Ambulatory Visit (INDEPENDENT_AMBULATORY_CARE_PROVIDER_SITE_OTHER): Payer: BC Managed Care – PPO | Admitting: Physician Assistant

## 2012-12-31 VITALS — BP 102/68 | HR 73 | Ht 63.0 in | Wt 226.2 lb

## 2012-12-31 DIAGNOSIS — K219 Gastro-esophageal reflux disease without esophagitis: Secondary | ICD-10-CM

## 2012-12-31 DIAGNOSIS — Z4651 Encounter for fitting and adjustment of gastric lap band: Secondary | ICD-10-CM

## 2012-12-31 NOTE — Patient Instructions (Signed)
Return in three weeks. Focus on good food choices as well as physical activity. Return sooner if you have an increase in hunger, portion sizes or weight. Return also for difficulty swallowing, night cough, reflux. Take Nexium 20mg  tablets (available over-the-counter), one tablet daily for two weeks.

## 2012-12-31 NOTE — Progress Notes (Signed)
  HISTORY: Karen Brock is a 42 y.o.female who received an AP-Standard lap-band in November 2011 by Dr. Ezzard Standing. She comes in with 9 lbs weight loss since her last visit in early July but unfortunately she's been suffering from GERD since then as well. She is having a bit more solid food dysphagia. Even liquids are causing some distress. It is very difficult for her to get anything down in the mornings. She has taken Zantac twice a day with very little relief.  VITAL SIGNS: Filed Vitals:   12/31/12 1441  BP: 102/68  Pulse: 73    PHYSICAL EXAM: Physical exam reveals a very well-appearing 42 y.o.female in no apparent distress Neurologic: Awake, alert, oriented Psych: Bright affect, conversant Respiratory: Breathing even and unlabored. No stridor or wheezing Abdomen: Soft, nontender, nondistended to palpation. Incisions well-healed. No incisional hernias. Port easily palpated. Extremities: Atraumatic, good range of motion.  ASSESMENT: 42 y.o.  female  s/p AP-Standard lap-band.   PLAN: The patient's port was accessed with a 20G Huber needle without difficulty. Clear fluid was aspirated and 0.5 mL saline was removed from the port to give a total predicted volume of 4.2 mL. The patient was advised to concentrate on healthy food choices and to avoid slider foods high in fats and carbohydrates. This was the volume of the last fill which was removed. I asked her to call us if her symptoms persist. She was able to swallow water with a notable difference after the fluid removal.

## 2013-01-21 ENCOUNTER — Encounter (INDEPENDENT_AMBULATORY_CARE_PROVIDER_SITE_OTHER): Payer: BC Managed Care – PPO

## 2013-05-06 ENCOUNTER — Encounter (INDEPENDENT_AMBULATORY_CARE_PROVIDER_SITE_OTHER): Payer: BC Managed Care – PPO

## 2013-06-03 ENCOUNTER — Ambulatory Visit (INDEPENDENT_AMBULATORY_CARE_PROVIDER_SITE_OTHER): Payer: BC Managed Care – PPO | Admitting: Physician Assistant

## 2013-06-03 ENCOUNTER — Encounter (INDEPENDENT_AMBULATORY_CARE_PROVIDER_SITE_OTHER): Payer: Self-pay

## 2013-06-03 ENCOUNTER — Encounter (INDEPENDENT_AMBULATORY_CARE_PROVIDER_SITE_OTHER): Payer: BC Managed Care – PPO

## 2013-06-03 VITALS — BP 120/70 | HR 88 | Temp 97.0°F | Resp 14 | Ht 63.0 in | Wt 240.0 lb

## 2013-06-03 DIAGNOSIS — Z4651 Encounter for fitting and adjustment of gastric lap band: Secondary | ICD-10-CM

## 2013-06-03 DIAGNOSIS — K219 Gastro-esophageal reflux disease without esophagitis: Secondary | ICD-10-CM

## 2013-06-03 NOTE — Progress Notes (Signed)
  HISTORY: Karen Brock is a 43 y.o.female who received an AP-Standard lap-band in November 2011 by Dr. Lucia Gaskins. She comes in with 13.8 lbs weight gain since her last visit in September 2014. She complains of daily GERD symptoms, necessitating TUMS about 3 times a day. She has not tried other medications. She complains of increased hunger and portion sizes as well as weight gain. She said the symptoms are not as bad as before fluid was removed during her last visit but they are nonetheless persistent.  VITAL SIGNS: Filed Vitals:   06/03/13 1118  BP: 120/70  Pulse: 88  Temp: 97 F (36.1 C)  Resp: 14    PHYSICAL EXAM: Physical exam reveals a very well-appearing 43 y.o.female in no apparent distress Neurologic: Awake, alert, oriented Psych: Bright affect, conversant Respiratory: Breathing even and unlabored. No stridor or wheezing Abdomen: Soft, nontender, nondistended to palpation. Incisions well-healed. No incisional hernias. Port easily palpated. Extremities: Atraumatic, good range of motion.  ASSESMENT: 43 y.o.  female  s/p AP-Standard lap-band.   PLAN: The patient's port was accessed with a 20G Huber needle without difficulty. Clear fluid was aspirated and 5 mL saline was removed from the port to give a total predicted volume of 0 mL. The patient was advised to concentrate on healthy food choices and to avoid slider foods high in fats and carbohydrates. I have recommended a OTC PPI (either Nexium or Prilosec) for the next two weeks. I have also ordered an upper GI to evaluate her anatomy. She voiced understanding and agreement. We'll have her back in two weeks.

## 2013-06-03 NOTE — Patient Instructions (Signed)
Return in two weeks. Focus on good food choices as well as physical activity. Return sooner if you have an increase in hunger, portion sizes or weight. Return also for difficulty swallowing, night cough, reflux.   

## 2013-06-04 ENCOUNTER — Telehealth (INDEPENDENT_AMBULATORY_CARE_PROVIDER_SITE_OTHER): Payer: Self-pay

## 2013-06-04 NOTE — Telephone Encounter (Signed)
Called and left message for patient regarding UGI scheduled for 06/15/13 @ 9:00am, patient cannot eat or drink after midnight.

## 2013-06-15 ENCOUNTER — Other Ambulatory Visit: Payer: BC Managed Care – PPO

## 2013-06-17 ENCOUNTER — Encounter (INDEPENDENT_AMBULATORY_CARE_PROVIDER_SITE_OTHER): Payer: BC Managed Care – PPO

## 2013-07-15 ENCOUNTER — Encounter (INDEPENDENT_AMBULATORY_CARE_PROVIDER_SITE_OTHER): Payer: BC Managed Care – PPO

## 2013-07-22 ENCOUNTER — Encounter (INDEPENDENT_AMBULATORY_CARE_PROVIDER_SITE_OTHER): Payer: BC Managed Care – PPO

## 2013-08-12 ENCOUNTER — Encounter (INDEPENDENT_AMBULATORY_CARE_PROVIDER_SITE_OTHER): Payer: BC Managed Care – PPO

## 2013-08-19 ENCOUNTER — Ambulatory Visit (INDEPENDENT_AMBULATORY_CARE_PROVIDER_SITE_OTHER): Payer: BC Managed Care – PPO | Admitting: Physician Assistant

## 2013-08-19 ENCOUNTER — Encounter (INDEPENDENT_AMBULATORY_CARE_PROVIDER_SITE_OTHER): Payer: Self-pay

## 2013-08-19 VITALS — BP 120/70 | HR 72 | Temp 98.5°F | Resp 14 | Ht 63.0 in | Wt 244.6 lb

## 2013-08-19 DIAGNOSIS — Z4651 Encounter for fitting and adjustment of gastric lap band: Secondary | ICD-10-CM

## 2013-08-19 NOTE — Progress Notes (Signed)
  HISTORY: Karen Brock is a 43 y.o.female who received an AP-Standard lap-band in November 2011 by Dr. Lucia Gaskins. She comes in 3 months after fluid removal for GERD with 4.6 lbs weight gain. She reports no further issues with GERD or regurgitation. She did not obtain her Upper GI as she was symptom free following fluid removal. She is eating more than desired and wants a fill. She reports having just run a 5k, so she's doing well with physical activity.  VITAL SIGNS: Filed Vitals:   08/19/13 1514  BP: 120/70  Pulse: 72  Temp: 98.5 F (36.9 C)  Resp: 14    PHYSICAL EXAM: Physical exam reveals a very well-appearing 43 y.o.female in no apparent distress Neurologic: Awake, alert, oriented Psych: Bright affect, conversant Respiratory: Breathing even and unlabored. No stridor or wheezing Abdomen: Soft, nontender, nondistended to palpation. Incisions well-healed. No incisional hernias. Port easily palpated. Extremities: Atraumatic, good range of motion.  ASSESMENT: 43 y.o.  female  s/p AP-Standard lap-band.   PLAN: The patient's port was accessed with a 20G Huber needle without difficulty. Clear fluid was aspirated and 3.5 mL saline was added to the port to give a total predicted volume of 3.5 mL. The patient was able to swallow water without difficulty following the procedure and was instructed to take clear liquids for the next 24-48 hours and advance slowly as tolerated.

## 2013-08-19 NOTE — Patient Instructions (Signed)

## 2013-09-16 ENCOUNTER — Encounter (INDEPENDENT_AMBULATORY_CARE_PROVIDER_SITE_OTHER): Payer: BC Managed Care – PPO

## 2014-01-03 ENCOUNTER — Other Ambulatory Visit: Payer: Self-pay

## 2014-01-03 DIAGNOSIS — Z1231 Encounter for screening mammogram for malignant neoplasm of breast: Secondary | ICD-10-CM

## 2014-01-07 ENCOUNTER — Ambulatory Visit
Admission: RE | Admit: 2014-01-07 | Discharge: 2014-01-07 | Disposition: A | Discharge status: Home / Self Care | Payer: BC Managed Care – PPO | Source: Ambulatory Visit

## 2014-01-07 ENCOUNTER — Encounter (INDEPENDENT_AMBULATORY_CARE_PROVIDER_SITE_OTHER): Payer: Self-pay

## 2014-01-07 DIAGNOSIS — Z1231 Encounter for screening mammogram for malignant neoplasm of breast: Secondary | ICD-10-CM

## 2014-02-14 ENCOUNTER — Encounter (INDEPENDENT_AMBULATORY_CARE_PROVIDER_SITE_OTHER): Payer: Self-pay

## 2014-04-26 ENCOUNTER — Other Ambulatory Visit: Payer: Self-pay | Admitting: Obstetrics and Gynecology

## 2014-08-02 ENCOUNTER — Other Ambulatory Visit: Payer: Self-pay | Admitting: *Deleted

## 2014-12-26 ENCOUNTER — Other Ambulatory Visit: Payer: Self-pay

## 2014-12-26 DIAGNOSIS — Z1231 Encounter for screening mammogram for malignant neoplasm of breast: Secondary | ICD-10-CM

## 2015-01-30 ENCOUNTER — Ambulatory Visit: Payer: BC Managed Care – PPO

## 2015-02-28 ENCOUNTER — Ambulatory Visit: Payer: BC Managed Care – PPO

## 2015-05-02 MED FILL — URSODIOL 300 MG CAPSULE: 300 | 30 days supply | Qty: 60 | Fill #0

## 2015-07-17 MED FILL — ESOMEPRAZOLE MAG DR 40 MG C: 40 | 30 days supply | Qty: 30 | Fill #0

## 2015-07-17 MED FILL — URSODIOL 300 MG CAPSULE: 300 | 30 days supply | Qty: 60 | Fill #1

## 2016-05-16 ENCOUNTER — Other Ambulatory Visit: Payer: Self-pay | Admitting: Obstetrics and Gynecology

## 2016-05-21 ENCOUNTER — Ambulatory Visit (INDEPENDENT_AMBULATORY_CARE_PROVIDER_SITE_OTHER): Payer: BC Managed Care – PPO | Admitting: Family Medicine

## 2016-05-21 VITALS — BP 130/80 | HR 98 | Temp 100.5°F | Resp 17 | Ht 64.5 in | Wt 240.0 lb

## 2016-05-21 DIAGNOSIS — J111 Influenza due to unidentified influenza virus with other respiratory manifestations: Secondary | ICD-10-CM | POA: Diagnosis not present

## 2016-05-21 DIAGNOSIS — R05 Cough: Secondary | ICD-10-CM | POA: Diagnosis not present

## 2016-05-21 DIAGNOSIS — R059 Cough, unspecified: Secondary | ICD-10-CM

## 2016-05-21 MED ORDER — HYDROCODONE-HOMATROPINE 5-1.5 MG/5ML PO SYRP: ORAL_SOLUTION | ORAL | 0 refills | Status: DC

## 2016-05-21 NOTE — Progress Notes (Signed)
By signing my name below, I, Mesha Guinyard, attest that this documentation has been prepared under the direction and in the presence of Merri Ray, MD.  Electronically Signed: Verlee Monte, Medical Scribe. 05/21/16. 8:44 AM.  Subjective:    Patient ID: Karen Brock, female    DOB: 08-16-1970, 46 y.o.   MRN: [id number]  HPI Chief Complaint  Patient presents with  . Cough  . Generalized Body Aches  . Fever  . Sinusitis    HPI Comments: Karen Brock is a 46 y.o. female who presents to the [facility] complaining of worsening fever onset 3 days ago. Sxs began as low grade fever of 100.6 and developed into increasing fever of 102, cough, congestion, sneezing, myalgias, loss of sleep, and HA. Took OTC cold and flu medicine for no relief of her sxs. Pt's son was sick with fever and congestion, but he recovered in a day. 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 Pt received her flu shot this year.  Patient Active Problem List   Diagnosis Date Noted  . 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 Metrorrhagia/possible polyp 11/26/2011  . Hx: UTI (urinary tract infection)   . Asthma   . GERD (gastroesophageal reflux disease)   . Abnormal Pap smear-h/o   . H/O varicella   . HPV in female   . Breast nodule   . ASCUS (atypical squamous cells of undetermined significance) on Pap smear 06/06/2011  . UTI in pregnancy 09/13/2008   Past Medical History:  Diagnosis Date  . Abnormal Pap smear 1999   [place]  . ASCUS (atypical squamous cells of undetermined significance) on Pap smear 06/06/2011  . Asthma   . Breast nodule 2009  . BV (bacterial vaginosis) 2009  . Chlamydial infection   . Fibroid 09/30/08   Cervical fibroid  . First trimester bleeding 02/2008  . GERD (gastroesophageal reflux disease)   . H/O varicella   . HPV in female 2002  . Hx: UTI (urinary tract infection)   . Increased BMI   . Spotting in first trimester 09/2008  . UTI in  pregnancy 09/2008  . Vaginitis and vulvovaginitis 2008   Past Surgical History:  Procedure Laterality Date  . BUNIONECTOMY  10/2004  . CESAREAN SECTION  09/30/2008  . fibroid removal  10/10/09  . FOOT ARTHRODESIS, MODIFIED [facility]BRIDE    . GASTRIC RESTRICTION SURGERY    . LAPAROSCOPIC GASTRIC BANDING  02/13/10  . MYOMECTOMY  10/10/09   Allergies  Allergen Reactions  . Other     Pt has seasonal allergies and allergic to some pets  . Shellfish Allergy Itching    Pt is allergic to crabmeat   Prior to Admission medications   Not on File   Social History   Social History  . Marital status: Single    Spouse name: N/A  . Number of children: N/A  . Years of education: N/A   Occupational History  . Not on file.   Social History Main Topics  . Smoking status: Never Smoker  . Smokeless tobacco: Never Used  . Alcohol use No  . Drug use: No  . Sexual activity: Yes    Birth control/ protection: Condom   Other Topics Concern  . Not on file   Social History Narrative  . No narrative on file   Review of Systems  Constitutional: Positive for fever.  HENT: Positive for congestion and sneezing.   Respiratory: Positive for cough.   Musculoskeletal:  Positive for myalgias.  Neurological: Positive for headaches.  Psychiatric/Behavioral: Positive for sleep disturbance.   Objective:  Physical Exam  Constitutional: She appears well-developed and well-nourished. No distress.  HENT:  Head: Normocephalic and atraumatic.  Right Ear: External ear normal.  Left Ear: External ear normal.  Nose: Nose normal.  Mouth/Throat: Oropharynx is clear and moist. No oropharyngeal exudate.  Eyes: Conjunctivae are normal.  Neck: Neck supple.  Cardiovascular: Normal rate, regular rhythm and normal heart sounds.  Exam reveals no gallop and no friction rub.   No murmur heard. Pulmonary/Chest: Effort normal and breath sounds normal. No respiratory distress. She has no wheezes. She has no rales.    Lymphadenopathy:    She has no cervical adenopathy.  Neurological: She is alert.  Skin: Skin is warm and dry.  Psychiatric: She has a normal mood and affect. Her behavior is normal.  Nursing note and vitals reviewed.  BP 130/80 (BP Location: Right Arm, Patient Position: Sitting, Cuff Size: Normal)   Pulse 98   Temp (!) 100.5 F (38.1 C) (Oral)   Resp 17   Ht 5' 4.5" (1.638 m)   Wt 240 lb (108.9 kg)   LMP 10/21/2012   SpO2 95%   BMI 40.56 kg/m  Assessment & Plan:    Karen Brock is a 46 y.o. female Influenza with respiratory manifestation  Cough - Plan: HYDROcodone-homatropine (HYCODAN) 5-1.5 MG/5ML syrup  Suspected influenza with fever, cough, myalgias. Outside of Tamiflu window. Reassuring exam, symptomatic care discussed, hydrocodone cough syrup if needed at night, side effects discussed. Out of work note given until fever free for 24 hours off of fever reducing medicine. RTC precautions.   Meds ordered this encounter  Medications  . HYDROcodone-homatropine (HYCODAN) 5-1.5 MG/5ML syrup    Sig: 29m by mouth a bedtime as needed for cough.    Dispense:  120 mL    Refill:  0   Patient Instructions    Unfortunately your symptoms do appear to be due to the flu.  Over the counter mucinex or mucinex DM as needed for cough, hydrocodone cough syrup at night if needed, tylenol or ibuprofen over the counter for fever and body aches, and drink plenty of fluids. Other information as in instructions below.  Return to the clinic or go to the nearest emergency room if any of your symptoms worsen or new symptoms occur. Do not return to work until you have been fever free for 24 hours off fever reducing medicine.   Influenza, Adult Influenza, more commonly known as "the flu," is a viral infection that primarily affects the respiratory tract. The respiratory tract includes organs that help you breathe, such as the lungs, nose, and throat. The flu causes many common cold symptoms, as well  as a high fever and body aches. The flu spreads easily from person to person (is contagious). Getting a flu shot (influenza vaccination) every year is the best way to prevent influenza. What are the causes? Influenza is caused by a virus. You can catch the virus by:  Breathing in droplets from an infected person's cough or sneeze.  Touching something that was recently contaminated with the virus and then touching your mouth, nose, or eyes. What increases the risk? The following factors may make you more likely to get the flu:  Not cleaning your hands frequently with soap and water or alcohol-based hand sanitizer.  Having close contact with many people during cold and flu season.  Touching your mouth, eyes, or nose without washing  or sanitizing your hands first.  Not drinking enough fluids or not eating a healthy diet.  Not getting enough sleep or exercise.  Being under a high amount of stress.  Not getting a yearly (annual) flu shot. You may be at a higher risk of complications from the flu, such as a severe lung infection (pneumonia), if you:  Are over the age of 39.  Are pregnant.  Have a weakened disease-fighting system (immune system). You may have a weakened immune system if you:  Have HIV or AIDS.  Are undergoing chemotherapy.  Aretaking medicines that reduce the activity of (suppress) the immune system.  Have a long-term (chronic) illness, such as heart disease, kidney disease, diabetes, or lung disease.  Have a liver disorder.  Are obese.  Have anemia. What are the signs or symptoms? Symptoms of this condition typically last 4-10 days and may include:  Fever.  Chills.  Headache, body aches, or muscle aches.  Sore throat.  Cough.  Runny or congested nose.  Chest discomfort and cough.  Poor appetite.  Weakness or tiredness (fatigue).  Dizziness.  Nausea or vomiting. How is this diagnosed? This condition may be diagnosed based on your  medical history and a physical exam. Your health care provider may do a nose or throat swab test to confirm the diagnosis. How is this treated? If influenza is detected early, you can be treated with antiviral medicine that can reduce the length of your illness and the severity of your symptoms. This medicine may be given by mouth (orally) or through an IV tube that is inserted in one of your veins. The goal of treatment is to relieve symptoms by taking care of yourself at home. This may include taking over-the-counter medicines, drinking plenty of fluids, and adding humidity to the air in your home. In some cases, influenza goes away on its own. Severe influenza or complications from influenza may be treated in a hospital. Follow these instructions at home:  Take over-the-counter and prescription medicines only as told by your health care provider.  Use a cool mist humidifier to add humidity to the air in your home. This can make breathing easier.  Rest as needed.  Drink enough fluid to keep your urine clear or pale yellow.  Cover your mouth and nose when you cough or sneeze.  Wash your hands with soap and water often, especially after you cough or sneeze. If soap and water are not available, use hand sanitizer.  Stay home from work or school as told by your health care provider. Unless you are visiting your health care provider, try to avoid leaving home until your fever has been gone for 24 hours without the use of medicine.  Keep all follow-up visits as told by your health care provider. This is important. How is this prevented?  Getting an annual flu shot is the best way to avoid getting the flu. You may get the flu shot in late summer, fall, or winter. Ask your health care provider when you should get your flu shot.  Wash your hands often or use hand sanitizer often.  Avoid contact with people who are sick during cold and flu season.  Eat a healthy diet, drink plenty of fluids,  get enough sleep, and exercise regularly. Contact a health care provider if:  You develop new symptoms.  You have:  Chest pain.  Diarrhea.  A fever.  Your cough gets worse.  You produce more mucus.  You feel nauseous or you  vomit. Get help right away if:  You develop shortness of breath or difficulty breathing.  Your skin or nails turn a bluish color.  You have severe pain or stiffness in your neck.  You develop a sudden headache or sudden pain in your face or ear.  You cannot stop vomiting. This information is not intended to replace advice given to you by your health care provider. Make sure you discuss any questions you have with your health care provider. Document Released: 03/29/2000 Document Revised: 09/07/2015 Document Reviewed: 01/24/2015 Elsevier Interactive Patient Education  2017 [employer].     IF you received an x-ray today, you will receive an invoice from [facility]. Please contact [facility] at [phone] with questions or concerns regarding your invoice.   IF you received labwork today, you will receive an invoice from [facility]. Please contact [facility] at [phone] with questions or concerns regarding your invoice.   Our billing staff will not be able to assist you with questions regarding bills from these companies.  You will be contacted with the lab results as soon as they are available. The fastest way to get your results is to activate your My Chart account. Instructions are located on the last page of this paperwork. If you have not heard from [place] regarding the results in 2 weeks, please contact this office.       I personally performed the services described in this documentation, which was scribed in my presence. The recorded information has been reviewed and considered for accuracy and completeness, addended by me as needed, and agree with information above.  Signed,   Merri Ray, MD [facility].  05/21/16 9:02 AM

## 2016-05-21 NOTE — Patient Instructions (Addendum)
Unfortunately your symptoms do appear to be due to the flu.  Over the counter mucinex or mucinex DM as needed for cough, hydrocodone cough syrup at night if needed, tylenol or ibuprofen over the counter for fever and body aches, and drink plenty of fluids. Other information as in instructions below.  Return to the clinic or go to the nearest emergency room if any of your symptoms worsen or new symptoms occur. Do not return to work until you have been fever free for 24 hours off fever reducing medicine.   Influenza, Adult Influenza, more commonly known as "the flu," is a viral infection that primarily affects the respiratory tract. The respiratory tract includes organs that help you breathe, such as the lungs, nose, and throat. The flu causes many common cold symptoms, as well as a high fever and body aches. The flu spreads easily from person to person (is contagious). Getting a flu shot (influenza vaccination) every year is the best way to prevent influenza. What are the causes? Influenza is caused by a virus. You can catch the virus by:  Breathing in droplets from an infected person's cough or sneeze.  Touching something that was recently contaminated with the virus and then touching your mouth, nose, or eyes. What increases the risk? The following factors may make you more likely to get the flu:  Not cleaning your hands frequently with soap and water or alcohol-based hand sanitizer.  Having close contact with many people during cold and flu season.  Touching your mouth, eyes, or nose without washing or sanitizing your hands first.  Not drinking enough fluids or not eating a healthy diet.  Not getting enough sleep or exercise.  Being under a high amount of stress.  Not getting a yearly (annual) flu shot. You may be at a higher risk of complications from the flu, such as a severe lung infection (pneumonia), if you:  Are over the age of 54.  Are pregnant.  Have a weakened  disease-fighting system (immune system). You may have a weakened immune system if you:  Have HIV or AIDS.  Are undergoing chemotherapy.  Aretaking medicines that reduce the activity of (suppress) the immune system.  Have a long-term (chronic) illness, such as heart disease, kidney disease, diabetes, or lung disease.  Have a liver disorder.  Are obese.  Have anemia. What are the signs or symptoms? Symptoms of this condition typically last 4-10 days and may include:  Fever.  Chills.  Headache, body aches, or muscle aches.  Sore throat.  Cough.  Runny or congested nose.  Chest discomfort and cough.  Poor appetite.  Weakness or tiredness (fatigue).  Dizziness.  Nausea or vomiting. How is this diagnosed? This condition may be diagnosed based on your medical history and a physical exam. Your health care provider may do a nose or throat swab test to confirm the diagnosis. How is this treated? If influenza is detected early, you can be treated with antiviral medicine that can reduce the length of your illness and the severity of your symptoms. This medicine may be given by mouth (orally) or through an IV tube that is inserted in one of your veins. The goal of treatment is to relieve symptoms by taking care of yourself at home. This may include taking over-the-counter medicines, drinking plenty of fluids, and adding humidity to the air in your home. In some cases, influenza goes away on its own. Severe influenza or complications from influenza may be treated in a hospital. Follow  these instructions at home:  Take over-the-counter and prescription medicines only as told by your health care provider.  Use a cool mist humidifier to add humidity to the air in your home. This can make breathing easier.  Rest as needed.  Drink enough fluid to keep your urine clear or pale yellow.  Cover your mouth and nose when you cough or sneeze.  Wash your hands with soap and water  often, especially after you cough or sneeze. If soap and water are not available, use hand sanitizer.  Stay home from work or school as told by your health care provider. Unless you are visiting your health care provider, try to avoid leaving home until your fever has been gone for 24 hours without the use of medicine.  Keep all follow-up visits as told by your health care provider. This is important. How is this prevented?  Getting an annual flu shot is the best way to avoid getting the flu. You may get the flu shot in late summer, fall, or winter. Ask your health care provider when you should get your flu shot.  Wash your hands often or use hand sanitizer often.  Avoid contact with people who are sick during cold and flu season.  Eat a healthy diet, drink plenty of fluids, get enough sleep, and exercise regularly. Contact a health care provider if:  You develop new symptoms.  You have:  Chest pain.  Diarrhea.  A fever.  Your cough gets worse.  You produce more mucus.  You feel nauseous or you vomit. Get help right away if:  You develop shortness of breath or difficulty breathing.  Your skin or nails turn a bluish color.  You have severe pain or stiffness in your neck.  You develop a sudden headache or sudden pain in your face or ear.  You cannot stop vomiting. This information is not intended to replace advice given to you by your health care provider. Make sure you discuss any questions you have with your health care provider. Document Released: 03/29/2000 Document Revised: 09/07/2015 Document Reviewed: 01/24/2015 Elsevier Interactive Patient Education  2017 [employer].     IF you received an x-ray today, you will receive an invoice from [facility], [facility], [facility]. Please contact [facility] at [phone] with questions or concerns regarding your invoice.   IF you received labwork today, you will receive an invoice from [facility]. Please contact [facility] at  [phone] with questions or concerns regarding your invoice.   Our billing staff will not be able to assist you with questions regarding bills from these companies.  You will be contacted with the lab results as soon as they are available. The fastest way to get your results is to activate your My Chart account. Instructions are located on the last page of this paperwork. If you have not heard from [place] regarding the results in 2 weeks, please contact this office.

## 2016-07-09 ENCOUNTER — Encounter: Payer: Self-pay | Admitting: Podiatry

## 2016-07-09 ENCOUNTER — Ambulatory Visit (INDEPENDENT_AMBULATORY_CARE_PROVIDER_SITE_OTHER): Payer: BC Managed Care – PPO

## 2016-07-09 ENCOUNTER — Ambulatory Visit (INDEPENDENT_AMBULATORY_CARE_PROVIDER_SITE_OTHER): Payer: BC Managed Care – PPO | Admitting: Podiatry

## 2016-07-09 DIAGNOSIS — M21619 Bunion of unspecified foot: Secondary | ICD-10-CM | POA: Diagnosis not present

## 2016-07-09 DIAGNOSIS — Z472 Encounter for removal of internal fixation device: Secondary | ICD-10-CM | POA: Diagnosis not present

## 2016-07-09 NOTE — Patient Instructions (Signed)
Pre-Operative Instructions  Congratulations, you have decided to take an important step to improving your quality of life.  You can be assured that the doctors of [facility] will be with you every step of the way.  1. Plan to be at the surgery center/hospital at least 1 (one) hour prior to your scheduled time unless otherwise directed by the surgical center/hospital staff.  You must have a responsible adult accompany you, remain during the surgery and drive you home.  Make sure you have directions to the surgical center/hospital and know how to get there on time. 2. For hospital based surgery you will need to obtain a history and physical form from your family physician within 1 month prior to the date of surgery- we will give you a form for you primary physician.  3. We make every effort to accommodate the date you request for surgery.  There are however, times where surgery dates or times have to be moved.  We will contact you as soon as possible if a change in schedule is required.   4. No Aspirin/Ibuprofen for one week before surgery.  If you are on aspirin, any non-steroidal anti-inflammatory medications (Mobic, Aleve, Ibuprofen) you should stop taking it 7 days prior to your surgery.  You make take Tylenol  For pain prior to surgery.  5. Medications- If you are taking daily heart and blood pressure medications, seizure, reflux, allergy, asthma, anxiety, pain or diabetes medications, make sure the surgery center/hospital is aware before the day of surgery so they may notify you which medications to take or avoid the day of surgery. 6. No food or drink after midnight the night before surgery unless directed otherwise by surgical center/hospital staff. 7. No alcoholic beverages 24 hours prior to surgery.  No smoking 24 hours prior to or 24 hours after surgery. 8. Wear loose pants or shorts- loose enough to fit over bandages, boots, and casts. 9. No slip on shoes, sneakers are best. 10. Bring  your boot with you to the surgery center/hospital.  Also bring crutches or a walker if your physician has prescribed it for you.  If you do not have this equipment, it will be provided for you after surgery. 11. If you have not been contracted by the surgery center/hospital by the day before your surgery, call to confirm the date and time of your surgery. 12. Leave-time from work may vary depending on the type of surgery you have.  Appropriate arrangements should be made prior to surgery with your employer. 13. Prescriptions will be provided immediately following surgery by your doctor.  Have these filled as soon as possible after surgery and take the medication as directed. 14. Remove nail polish on the operative foot. 15. Wash the night before surgery.  The night before surgery wash the foot and leg well with the antibacterial soap provided and water paying special attention to beneath the toenails and in between the toes.  Rinse thoroughly with water and dry well with a towel.  Perform this wash unless told not to do so by your physician.  Enclosed: 1 Ice pack (please put in freezer the night before surgery)   1 Hibiclens skin cleaner   Pre-op Instructions  If you have any questions regarding the instructions, do not hesitate to call our office.  [place]: [address] [place], [place] [address] [phone]  [place]: [address] [place], [place] [address] [phone]  [place]: [address]  [place], [place] [address] [phone]   Dr.   Bence Trapp DPM, Dr. Matthew Wagoner DPM, Dr. M. Todd Hyatt DPM, Dr. Titorya Stover DPM 

## 2016-07-09 NOTE — Progress Notes (Signed)
   Subjective:    Patient ID: Karen Brock, female    DOB: 29-Jul-1970, 46 y.o.   MRN: [id number]  HPI Chief Complaint  Patient presents with  . Foot Pain    Bilateral; bunion; Right foot; dorsal (towards medial side); pt stated, "Dr. Amalia Hailey did bunion surgery in 2005 on the Right foot; can feel a pin on surgical site; Left foot bunion is now huring"; x1 month      Review of Systems  Musculoskeletal: Positive for myalgias.  All other systems reviewed and are negative.      Objective:   Physical Exam        Assessment & Plan:

## 2016-07-10 ENCOUNTER — Telehealth: Payer: Self-pay

## 2016-07-10 NOTE — Progress Notes (Signed)
Subjective:     Patient ID: Karen Brock, female   DOB: 06-11-1970, 46 y.o.   MRN: [id number]  HPI patient has not been seen in a number of years and had bunion surgery right by another physician in our group and presents with significant bunion deformity left and also prominent pin position of the first metatarsal which is occurred over the last few months   Review of Systems  All other systems reviewed and are negative.      Objective:   Physical Exam  Constitutional: She is oriented to person, place, and time.  Cardiovascular: Intact distal pulses.   Musculoskeletal: Normal range of motion.  Neurological: She is oriented to person, place, and time.  Skin: Skin is warm.  Nursing note and vitals reviewed.  neurovascular status found to be intact with muscle strength adequate range of motion within normal limits with patient noted to have large hyperostosis medial aspect first metatarsal head left with redness around the joint well-healed surgical site right with good alignment of the first metatarsal and prominent pin position right secondary to previous implantation. 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 At this point I do think structural correction of the bunion is necessary along with removal of pin from the right foot. Patient wants surgery and at this point she is allowed to read consent form going over what would occur due to the fact she is educated from the previous foot. Patient understands recovery is approximate 6 months for this procedure and understands all risk as outlined and signs consent form. I've recommended removal of at least one pin from the right foot possibly to if there both  involved and distal osteotomy left explaining we will not be able to get complete: Radiographic correction but I think she did so well with the other one that she should do well with a distal osteotomy. I did also dispensed air fracture walker at the current time with instructions on usage  X-ray indicates there is significant elevation of the intermetatarsal angle left of approximately 17 and there is prominent pin position noted right

## 2016-07-10 NOTE — Telephone Encounter (Signed)
LVM for patient to call with questions or concerns regarding yesterdays office visit, per Dr Paulla Dolly

## 2016-07-30 ENCOUNTER — Telehealth: Payer: Self-pay | Admitting: *Deleted

## 2016-07-30 NOTE — Telephone Encounter (Signed)
"  I was supposed to call you weeks ago to schedule my surgery with Dr. Paulla Dolly."  Do you have a date you would like to do it?  "I'd like to do it on June 26."  I will get it scheduled.  You should hear from someone from the surgical center with the arrival time the Friday or Monday prior to surgery date.  You can register now with the surgical center on-line.

## 2016-10-08 ENCOUNTER — Encounter: Payer: Self-pay | Admitting: Podiatry

## 2016-10-08 DIAGNOSIS — Z4889 Encounter for other specified surgical aftercare: Secondary | ICD-10-CM | POA: Diagnosis not present

## 2016-10-08 DIAGNOSIS — M2012 Hallux valgus (acquired), left foot: Secondary | ICD-10-CM | POA: Diagnosis not present

## 2016-10-11 NOTE — Progress Notes (Signed)
DOS 06.26.2018   1. Austin bunionectomy (cutting and moving bone) with pin fixation left.   2. Removal of 1 or 2 pins right.

## 2016-10-14 ENCOUNTER — Encounter: Payer: Self-pay | Admitting: Podiatry

## 2016-10-14 ENCOUNTER — Ambulatory Visit (INDEPENDENT_AMBULATORY_CARE_PROVIDER_SITE_OTHER): Payer: BC Managed Care – PPO

## 2016-10-14 ENCOUNTER — Ambulatory Visit (INDEPENDENT_AMBULATORY_CARE_PROVIDER_SITE_OTHER): Payer: BC Managed Care – PPO | Admitting: Podiatry

## 2016-10-14 VITALS — Temp 98.2°F

## 2016-10-14 DIAGNOSIS — R87622 Low grade squamous intraepithelial lesion on cytologic smear of vagina (LGSIL): Secondary | ICD-10-CM

## 2016-10-14 DIAGNOSIS — M21619 Bunion of unspecified foot: Secondary | ICD-10-CM

## 2016-10-14 DIAGNOSIS — Z472 Encounter for removal of internal fixation device: Secondary | ICD-10-CM

## 2016-10-14 DIAGNOSIS — R87612 Low grade squamous intraepithelial lesion on cytologic smear of cervix (LGSIL): Secondary | ICD-10-CM | POA: Insufficient documentation

## 2016-10-14 DIAGNOSIS — R87810 Cervical high risk human papillomavirus (HPV) DNA test positive: Secondary | ICD-10-CM | POA: Insufficient documentation

## 2016-10-14 DIAGNOSIS — Z6841 Body Mass Index (BMI) 40.0 and over, adult: Secondary | ICD-10-CM

## 2016-10-14 DIAGNOSIS — N912 Amenorrhea, unspecified: Secondary | ICD-10-CM | POA: Insufficient documentation

## 2016-10-14 NOTE — Progress Notes (Signed)
Subjective:    Patient ID: Karen Brock, female   DOB: 46 y.o.   MRN: [id number]   HPI patient states doing real well with the left foot and removal of pin right with minimal discomfort and able to walk distances without pain currently    ROS      Objective:  Physical Exam neurovascular status intact negative Homans sign noted with patient's left foot doing well with osteotomy in place no signs of movement joint congruence     Assessment:   Doing well post osteotomy first metatarsal of the left foot with the right foot also doing well post pin removal      Plan:    X-ray condition reviewed and at this point recommended gradual increase in activities and normal shoe gear at the current time. Patient was given encouragement on range of motion exercises for the first MPJ of the left foot  X-rays indicate the osteotomy site left is looking good pins in place no signs of movement and the right looks good with pins removed and joint congruence

## 2016-10-28 ENCOUNTER — Encounter: Payer: Self-pay | Admitting: Podiatry

## 2016-10-28 ENCOUNTER — Ambulatory Visit (INDEPENDENT_AMBULATORY_CARE_PROVIDER_SITE_OTHER): Payer: Self-pay | Admitting: Podiatry

## 2016-10-28 DIAGNOSIS — Z472 Encounter for removal of internal fixation device: Secondary | ICD-10-CM

## 2016-10-28 DIAGNOSIS — M21619 Bunion of unspecified foot: Secondary | ICD-10-CM

## 2016-10-31 NOTE — Progress Notes (Signed)
Subjective: Karen Brock is a 46 y.o. is seen today in office s/p left bunionectomy and HWR right foot  preformed on 10/08/2016 with Dr. Paulla Dolly. They state their pain is controlled. She has remained in a surgical boot on the left side. She states that she is doing well. Denies any systemic complaints such as fevers, chills, nausea, vomiting. No calf pain, chest pain, shortness of breath.   Objective: General: No acute distress, AAOx3  DP/PT pulses palpable 2/4, CRT < 3 sec to all digits.  Protective sensation intact. Motor function intact.  Bilateral foot: Incision is well coapted without any evidence of dehiscence and sutures are intact. There is no surrounding erythema, ascending cellulitis, fluctuance, crepitus, malodor, drainage/purulence. There is mild edema around the surgical site. There is minimal pain along the surgical site. No pain with MPJ range of motion. Ptosis in rectus position. No other areas of tenderness to bilateral lower extremities.  No other open lesions or pre-ulcerative lesions.  No pain with calf compression, swelling, warmth, erythema.   Assessment and Plan:  Status post bilateral foot surgery, doing well with no complications   -Treatment options discussed including all alternatives, risks, and complications -Sutures removed today without complications. After removal the incisions remain well coapted. Antibiotic ointment and a bandage was applied. She'll start to shower tomorrow have her monitor for any skin breakdown or opening of the incision. -Remaining surgical boot on left foot. She is wearing a sandal on the right side. -Ice/elevation -Pain medication as needed. -Monitor for any clinical signs or symptoms of infection and DVT/PE and directed to call the office immediately should any occur or go to the ER. -Follow-up in 2 weeks with Dr. Paulla Dolly or sooner if any problems arise. In the meantime, encouraged to call the office with any questions, concerns, change in  symptoms.   Celesta Gentile, DPM

## 2016-11-13 ENCOUNTER — Ambulatory Visit (INDEPENDENT_AMBULATORY_CARE_PROVIDER_SITE_OTHER): Payer: BC Managed Care – PPO

## 2016-11-13 ENCOUNTER — Encounter: Payer: Self-pay | Admitting: Podiatry

## 2016-11-13 ENCOUNTER — Ambulatory Visit: Payer: BC Managed Care – PPO

## 2016-11-13 ENCOUNTER — Ambulatory Visit (INDEPENDENT_AMBULATORY_CARE_PROVIDER_SITE_OTHER): Payer: BC Managed Care – PPO | Admitting: Podiatry

## 2016-11-13 DIAGNOSIS — M2041 Other hammer toe(s) (acquired), right foot: Secondary | ICD-10-CM

## 2016-11-13 DIAGNOSIS — M21619 Bunion of unspecified foot: Secondary | ICD-10-CM

## 2016-11-13 DIAGNOSIS — M2042 Other hammer toe(s) (acquired), left foot: Secondary | ICD-10-CM | POA: Diagnosis not present

## 2016-11-13 NOTE — Progress Notes (Signed)
Subjective:    Patient ID: Karen Brock, female   DOB: 46 y.o.   MRN: [id number]   HPI patient states she's doing well with mild discomfort but overall feeling good and states that the range of motion of the joint is good and that she is still wearing surgical shoe but is ready to return to soft shoes    ROS      Objective:  Physical Exam neurovascular status intact negative Homans sign was noted with first MPJ left showing good alignment with good range of motion no crepitus with wound edges well coapted and well-healing surgical site right first metatarsal     Assessment:     Doing well post osteotomy first metatarsal left and removal of pin right    Plan:    H&P condition reviewed and discussed x-ray. Patient can return to gradual increase in shoe gear usage and activity but we'll continue to be careful with any form of excessive activity. Patient be rechecked again in the next several weeks  X-rays indicate that the osteotomy is healing well with fixation in place and good alignment with joint congruence

## 2016-12-25 ENCOUNTER — Ambulatory Visit: Payer: BC Managed Care – PPO | Admitting: Podiatry

## 2017-05-20 ENCOUNTER — Other Ambulatory Visit: Payer: Self-pay | Admitting: Obstetrics and Gynecology

## 2017-07-18 ENCOUNTER — Other Ambulatory Visit: Payer: Self-pay | Admitting: Obstetrics and Gynecology

## 2018-05-29 ENCOUNTER — Emergency Department (HOSPITAL_COMMUNITY)
Admission: EM | Admit: 2018-05-29 | Discharge: 2018-05-30 | Disposition: A | Discharge status: Home / Self Care | Payer: BC Managed Care – PPO | Attending: Emergency Medicine | Admitting: Emergency Medicine

## 2018-05-29 ENCOUNTER — Encounter (HOSPITAL_COMMUNITY): Payer: Self-pay

## 2018-05-29 DIAGNOSIS — Y998 Other external cause status: Secondary | ICD-10-CM | POA: Diagnosis not present

## 2018-05-29 DIAGNOSIS — Z79899 Other long term (current) drug therapy: Secondary | ICD-10-CM | POA: Diagnosis not present

## 2018-05-29 DIAGNOSIS — Y939 Activity, unspecified: Secondary | ICD-10-CM | POA: Insufficient documentation

## 2018-05-29 DIAGNOSIS — Y929 Unspecified place or not applicable: Secondary | ICD-10-CM | POA: Insufficient documentation

## 2018-05-29 DIAGNOSIS — S50862A Insect bite (nonvenomous) of left forearm, initial encounter: Secondary | ICD-10-CM | POA: Insufficient documentation

## 2018-05-29 DIAGNOSIS — W57XXXA Bitten or stung by nonvenomous insect and other nonvenomous arthropods, initial encounter: Secondary | ICD-10-CM | POA: Diagnosis not present

## 2018-05-29 DIAGNOSIS — J45909 Unspecified asthma, uncomplicated: Secondary | ICD-10-CM | POA: Diagnosis not present

## 2018-05-29 NOTE — ED Triage Notes (Signed)
Pt states that she has been staying at her parents house and woke up with two swollen red spots on her L arm that are itching, last week she has a spot on her hip

## 2018-05-30 ENCOUNTER — Other Ambulatory Visit: Payer: Self-pay

## 2018-05-30 MED ORDER — DEXAMETHASONE SODIUM PHOSPHATE 10 MG/ML IJ SOLN
10.0000 mg | Freq: Once | INTRAMUSCULAR | Status: AC
  Administered 2018-05-30: 10 mg via INTRAMUSCULAR
  Filled 2018-05-30: qty 1

## 2018-05-30 MED ORDER — DOXYCYCLINE HYCLATE 100 MG PO CAPS: 100.0000 mg | ORAL_CAPSULE | Freq: Two times a day (BID) | ORAL | 0 refills | Status: AC

## 2018-05-30 NOTE — ED Provider Notes (Signed)
[facility] EMERGENCY DEPARTMENT Provider Note   CSN: [id number] Arrival date & time: 05/29/18  2220     History   Chief Complaint Chief Complaint  Patient presents with  . Insect Bite    HPI Karen Brock is a 48 y.o. female.  Patient presents to the emergency department with a chief complaint of allergic reaction.  Patient states that she has noticed 2 bites on her left arm.  States that there is surrounding swelling.  Reports pruritus.  States that she had another bite on her right hip last week, but this is resolved.  She has been staying at her parents house.  Denies any fevers, chills, shortness of breath, throat swelling, chest pain, shortness of breath, nausea, vomiting, diarrhea.  She has taken Benadryl and used hydrocortisone cream with some relief.  The history is provided by the patient. No language interpreter was used.    Past Medical History:  Diagnosis Date  . Abnormal Pap smear 1999   [place]  . Allergy   . ASCUS (atypical squamous cells of undetermined significance) on Pap smear 06/06/2011  . Asthma   . Breast nodule 2009  . BV (bacterial vaginosis) 2009  . Chlamydial infection   . Fibroid 09/30/08   Cervical fibroid  . First trimester bleeding 02/2008  . GERD (gastroesophageal reflux disease)   . H/O varicella   . HPV in female 2002  . Hx: UTI (urinary tract infection)   . Increased BMI   . Spotting in first trimester 09/2008  . UTI in pregnancy 09/2008  . Vaginitis and vulvovaginitis 2008    Patient Active Problem List   Diagnosis Date Noted  . Amenorrhea 10/14/2016  . Morbid obesity with BMI of 40.0-44.9, adult ([place]) 10/14/2016  . Low grade squamous intraepithelial lesion (LGSIL) on cervicovaginal cytologic smear 10/14/2016  . Cervical high risk HPV (human papillomavirus) test positive 10/14/2016  . History of surgical procedure 10/15/2012  . Metrorrhagia/possible polyp 11/26/2011  . History of urinary tract infection   .  Asthma   . Gastroesophageal reflux disease   . Abnormal Pap smear-h/o   . History of varicella   . Human papilloma virus (HPV) infection   . Mass of breast   . Abnormal cytological findings in specimens from other organs, systems and tissues 06/06/2011  . UTI in pregnancy 09/13/2008    Past Surgical History:  Procedure Laterality Date  . BUNIONECTOMY  10/2004  . CESAREAN SECTION  09/30/2008  . fibroid removal  10/10/09  . FOOT ARTHRODESIS, MODIFIED [facility]    . GASTRIC RESTRICTION SURGERY    . LAPAROSCOPIC GASTRIC BANDING  02/13/10  . MYOMECTOMY  10/10/09     OB History    Gravida  1   Para  1   Term  1   Preterm      AB      Living  1     SAB      TAB      Ectopic      Multiple      Live Births  1            Home Medications    Prior to Admission medications   Medication Sig Start Date End Date Taking? Authorizing Provider  Biotin 1000 MCG CHEW Chew by mouth.    [provider]  Ca Phosphate-Cholecalciferol 250-400 MG-UNIT CHEW Chew by mouth.    [provider]  Multiple Vitamins-Minerals (MULTIVITAMIN ADULT) CHEW Chew by mouth.  [provider]  Vitamin D, Ergocalciferol, (DRISDOL) 50000 units CAPS capsule TAKE 1 CAPSULE EVERY WEEK BY ORAL ROUTE FOR 84 DAYS. 03/17/15   [provider]    Family History Family History  Problem Relation Age of Onset  . Hypertension Father   . Diabetes Father   . Cancer Father   . Stroke Father   . Hypertension Mother   . Heart failure Mother   . Heart disease Mother        congestive heart failure  . Heart disease Maternal Grandmother   . Cancer Maternal Grandfather        prostate  . Alcohol abuse Maternal Grandfather     Social History Social History   Tobacco Use  . Smoking status: Never Smoker  . Smokeless tobacco: Never Used  Substance Use Topics  . Alcohol use: No  . Drug use: No     Allergies   Shellfish-derived products; Hydrocodone; Other; and Shellfish  allergy   Review of Systems Review of Systems  All other systems reviewed and are negative.    Physical Exam Updated Vital Signs BP 121/74   Pulse 76   Temp 97.8 F (36.6 C) (Oral)   Resp 20   Ht 5\' 3"  (1.6 m)   Wt 107.5 kg   LMP 10/21/2012   SpO2 100%   BMI 41.98 kg/m   Physical Exam Vitals signs and nursing note reviewed.  Constitutional:      General: She is not in acute distress.    Appearance: She is not diaphoretic.  HENT:     Head: Normocephalic and atraumatic.  Eyes:     Conjunctiva/sclera: Conjunctivae normal.     Pupils: Pupils are equal, round, and reactive to light.  Neck:     Trachea: No tracheal deviation.  Cardiovascular:     Rate and Rhythm: Normal rate.  Pulmonary:     Effort: Pulmonary effort is normal. No respiratory distress.  Abdominal:     Palpations: Abdomen is soft.  Musculoskeletal: Normal range of motion.  Skin:    General: Skin is warm and dry.     Comments: 2 large erythematous regions on left upper extremity, 1 approximately 4 x 4 cm in the distal forearm, another about 6 x 6 cm on the proximal arm, there is some mild swelling, appearance seems characteristic of allergic reaction and not that of cellulitis, there is no abscess  Neurological:     Mental Status: She is alert and oriented to person, place, and time.  Psychiatric:        Judgment: Judgment normal.      ED Treatments / Results  Labs (all labs ordered are listed, but only abnormal results are displayed) Labs Reviewed - No data to display  EKG None  Radiology No results found.  Procedures Procedures (including critical care time)  Medications Ordered in ED Medications  dexamethasone (DECADRON) injection 10 mg (has no administration in time range)     Initial Impression / Assessment and Plan / ED Course  I have reviewed the triage vital signs and the nursing notes.  Pertinent labs & imaging results that were available during my care of the patient were  reviewed by me and considered in my medical decision making (see chart for details).     Patient with probable allergic reaction.  Her symptoms deemed to come from bites, and possibly allergy from what ever is biting her.  No other systemic symptoms to suggest anaphylactic type reaction I doubt cellulitis  at this time, but have encouraged her to monitor her symptoms closely.  If she notices increasing redness, pain, or fever, she is to start taking antibiotic.  Otherwise she is to continue with Benadryl and hydrocortisone cream and follow-up with her primary care doctor.  Final Clinical Impressions(s) / ED Diagnoses   Final diagnoses:  Insect bite of left forearm, initial encounter    ED Discharge Orders         Ordered    doxycycline (VIBRAMYCIN) 100 MG capsule  2 times daily     05/30/18 0455           Montine Circle, PA-C 05/30/18 0455    Orpah Greek, MD 05/30/18 2342

## 2018-05-30 NOTE — ED Notes (Signed)
Reviewed d/c instructions with pt, who verbalized understanding and had no outstanding questions. Pt departed in NAD, refused use of wheelchair.   

## 2018-09-01 ENCOUNTER — Other Ambulatory Visit: Payer: Self-pay | Admitting: Obstetrics and Gynecology

## 2019-06-14 ENCOUNTER — Ambulatory Visit: Payer: BC Managed Care – PPO | Attending: Internal Medicine

## 2024-04-01 ENCOUNTER — Other Ambulatory Visit (HOSPITAL_COMMUNITY): Payer: Self-pay

## 2024-04-01 MED ORDER — METFORMIN HCL 500 MG PO TABS: 500.0000 mg | ORAL_TABLET | Freq: Every day | ORAL | 1 refills | Status: AC

## 2024-04-01 MED ORDER — ESTRADIOL 0.01 % VA CREA
TOPICAL_CREAM | VAGINAL | 2 refills | Status: AC
  Filled 2024-04-19: qty 42.5, 30d supply, fill #0

## 2024-04-01 MED ORDER — FLUCONAZOLE 150 MG PO TABS: 150.0000 mg | ORAL_TABLET | Freq: Every day | ORAL | 1 refills | Status: AC | PRN

## 2024-04-01 MED ORDER — XACIATO 2 % VA GEL: 1.0000 | Freq: Every day | VAGINAL | 1 refills | Status: AC | PRN

## 2024-04-01 MED ORDER — PROGESTERONE MICRONIZED 100 MG PO CAPS
100.0000 mg | ORAL_CAPSULE | Freq: Every day | ORAL | 2 refills | Status: AC
  Filled 2024-04-01: qty 90, 90d supply, fill #0

## 2024-04-01 MED ORDER — TINIDAZOLE 500 MG PO TABS
500.0000 mg | ORAL_TABLET | Freq: Every day | ORAL | 1 refills | Status: AC | PRN
  Filled 2024-04-01: qty 8, 8d supply, fill #0
  Filled 2024-04-01: qty 8, 2d supply, fill #0

## 2024-04-09 ENCOUNTER — Other Ambulatory Visit (HOSPITAL_COMMUNITY): Payer: Self-pay

## 2024-04-09 MED ORDER — IMVEXXY STARTER PACK 10 MCG VA INST
VAGINAL_INSERT | VAGINAL | 2 refills | Status: AC
  Filled 2024-04-09: qty 18, 28d supply, fill #0

## 2024-04-09 MED ORDER — ESTRADIOL 0.05 MG/24HR TD PTTW
1.0000 | MEDICATED_PATCH | TRANSDERMAL | 1 refills | Status: AC
  Filled 2024-04-09 – 2024-04-19 (×2): qty 24, 84d supply, fill #0

## 2024-04-09 MED ORDER — PROGESTERONE MICRONIZED 100 MG PO CAPS
100.0000 mg | ORAL_CAPSULE | Freq: Every day | ORAL | 2 refills | Status: AC
  Filled 2024-04-09 – 2024-04-19 (×2): qty 90, 90d supply, fill #0

## 2024-04-14 ENCOUNTER — Other Ambulatory Visit (HOSPITAL_COMMUNITY): Payer: Self-pay

## 2024-04-14 MED ORDER — ESTRADIOL 0.01 % VA CREA
TOPICAL_CREAM | VAGINAL | 2 refills | Status: AC
  Filled 2024-04-14: qty 42.5, 80d supply, fill #0
  Filled 2024-04-19 – 2024-04-30 (×2): qty 42.5, 30d supply, fill #0

## 2024-04-19 ENCOUNTER — Encounter (HOSPITAL_COMMUNITY): Payer: Self-pay | Admitting: Pharmacist

## 2024-04-19 ENCOUNTER — Other Ambulatory Visit (HOSPITAL_COMMUNITY): Payer: Self-pay

## 2024-04-22 ENCOUNTER — Other Ambulatory Visit: Payer: Self-pay

## 2024-04-26 ENCOUNTER — Other Ambulatory Visit (HOSPITAL_COMMUNITY): Payer: Self-pay

## 2024-04-27 ENCOUNTER — Other Ambulatory Visit: Payer: Self-pay

## 2024-04-29 ENCOUNTER — Other Ambulatory Visit (HOSPITAL_COMMUNITY): Payer: Self-pay

## 2024-04-30 ENCOUNTER — Other Ambulatory Visit (HOSPITAL_COMMUNITY): Payer: Self-pay

## 2024-05-01 ENCOUNTER — Other Ambulatory Visit (HOSPITAL_COMMUNITY): Payer: Self-pay

## 2024-05-07 ENCOUNTER — Other Ambulatory Visit: Payer: Self-pay

## 2024-05-07 ENCOUNTER — Other Ambulatory Visit (HOSPITAL_COMMUNITY): Payer: Self-pay

## 2024-05-07 MED ORDER — BUPROPION HCL ER (XL) 300 MG PO TB24
300.0000 mg | ORAL_TABLET | Freq: Every day | ORAL | 1 refills | Status: AC
  Filled 2024-05-07: qty 90, 90d supply, fill #0

## 2024-05-07 MED ORDER — PHENTERMINE HCL 15 MG PO CAPS
15.0000 mg | ORAL_CAPSULE | Freq: Every day | ORAL | 0 refills | Status: AC
  Filled 2024-05-07: qty 30, 30d supply, fill #0
